# Patient Record
Sex: Female | Born: 1975
Health system: Southern US, Community
[De-identification: ages and names within clinical notes are randomized; demographics above are authoritative.]

## PROBLEM LIST (undated history)

## (undated) ENCOUNTER — Inpatient Hospital Stay (HOSPITAL_COMMUNITY): Payer: Self-pay

## (undated) DIAGNOSIS — Z87442 Personal history of urinary calculi: Secondary | ICD-10-CM

## (undated) DIAGNOSIS — D473 Essential (hemorrhagic) thrombocythemia: Secondary | ICD-10-CM

## (undated) DIAGNOSIS — Z8759 Personal history of other complications of pregnancy, childbirth and the puerperium: Secondary | ICD-10-CM

## (undated) DIAGNOSIS — R51 Headache: Secondary | ICD-10-CM

## (undated) DIAGNOSIS — D75839 Thrombocytosis, unspecified: Secondary | ICD-10-CM

## (undated) DIAGNOSIS — Z8619 Personal history of other infectious and parasitic diseases: Secondary | ICD-10-CM

## (undated) HISTORY — DX: Essential (hemorrhagic) thrombocythemia: D47.3

## (undated) HISTORY — DX: Personal history of other complications of pregnancy, childbirth and the puerperium: Z87.59

## (undated) HISTORY — DX: Personal history of other infectious and parasitic diseases: Z86.19

## (undated) HISTORY — PX: APPENDECTOMY: SHX54

## (undated) HISTORY — DX: Thrombocytosis, unspecified: D75.839

---

## 2002-09-19 ENCOUNTER — Encounter: Payer: Self-pay | Admitting: Obstetrics and Gynecology

## 2002-09-19 ENCOUNTER — Encounter (INDEPENDENT_AMBULATORY_CARE_PROVIDER_SITE_OTHER): Payer: Self-pay

## 2002-09-19 ENCOUNTER — Encounter: Payer: Self-pay | Admitting: Emergency Medicine

## 2002-09-19 ENCOUNTER — Ambulatory Visit (HOSPITAL_COMMUNITY): Admission: AD | Admit: 2002-09-19 | Discharge: 2002-09-19 | Payer: Self-pay | Admitting: Family Medicine

## 2002-09-19 HISTORY — PX: OTHER SURGICAL HISTORY: SHX169

## 2002-10-11 ENCOUNTER — Encounter: Admission: RE | Admit: 2002-10-11 | Discharge: 2002-10-11 | Payer: Self-pay | Admitting: Obstetrics & Gynecology

## 2009-06-25 ENCOUNTER — Inpatient Hospital Stay (HOSPITAL_COMMUNITY): Admission: AD | Admit: 2009-06-25 | Discharge: 2009-06-25 | Payer: Self-pay | Admitting: Obstetrics & Gynecology

## 2009-12-01 ENCOUNTER — Inpatient Hospital Stay (HOSPITAL_COMMUNITY): Admission: AD | Admit: 2009-12-01 | Discharge: 2009-12-01 | Payer: Self-pay | Admitting: Obstetrics and Gynecology

## 2009-12-01 ENCOUNTER — Ambulatory Visit: Payer: Self-pay | Admitting: Obstetrics and Gynecology

## 2009-12-01 ENCOUNTER — Inpatient Hospital Stay (HOSPITAL_COMMUNITY): Admission: AD | Admit: 2009-12-01 | Discharge: 2009-12-02 | Payer: Self-pay | Admitting: Family Medicine

## 2009-12-02 ENCOUNTER — Ambulatory Visit: Payer: Self-pay | Admitting: Family Medicine

## 2009-12-02 ENCOUNTER — Encounter: Payer: Self-pay | Admitting: Family Medicine

## 2009-12-02 ENCOUNTER — Inpatient Hospital Stay (HOSPITAL_COMMUNITY): Admission: AD | Admit: 2009-12-02 | Discharge: 2009-12-04 | Payer: Self-pay | Admitting: Family Medicine

## 2010-05-16 ENCOUNTER — Encounter: Payer: Self-pay | Admitting: Family Medicine

## 2010-07-09 LAB — CBC
HCT: 34.6 % — ABNORMAL LOW (ref 36.0–46.0)
Hemoglobin: 11.3 g/dL — ABNORMAL LOW (ref 12.0–15.0)
Hemoglobin: 9.6 g/dL — ABNORMAL LOW (ref 12.0–15.0)
MCH: 26.3 pg (ref 26.0–34.0)
MCHC: 32.8 g/dL (ref 30.0–36.0)
MCV: 81.2 fL (ref 78.0–100.0)
Platelets: 432 10*3/uL — ABNORMAL HIGH (ref 150–400)
RBC: 3.67 MIL/uL — ABNORMAL LOW (ref 3.87–5.11)
RDW: 18.4 % — ABNORMAL HIGH (ref 11.5–15.5)
WBC: 19.1 10*3/uL — ABNORMAL HIGH (ref 4.0–10.5)

## 2010-07-18 LAB — DIFFERENTIAL
Basophils Absolute: 0.1 10*3/uL (ref 0.0–0.1)
Basophils Relative: 1 % (ref 0–1)
Eosinophils Absolute: 0.6 10*3/uL (ref 0.0–0.7)
Eosinophils Relative: 6 % — ABNORMAL HIGH (ref 0–5)
Lymphocytes Relative: 23 % (ref 12–46)
Lymphs Abs: 2.1 10*3/uL (ref 0.7–4.0)
Monocytes Absolute: 0.6 10*3/uL (ref 0.1–1.0)
Monocytes Relative: 6 % (ref 3–12)
Neutro Abs: 6 10*3/uL (ref 1.7–7.7)
Neutrophils Relative %: 64 % (ref 43–77)

## 2010-07-18 LAB — COMPREHENSIVE METABOLIC PANEL
ALT: 16 U/L (ref 0–35)
AST: 17 U/L (ref 0–37)
Albumin: 2.8 g/dL — ABNORMAL LOW (ref 3.5–5.2)
Alkaline Phosphatase: 72 U/L (ref 39–117)
BUN: 6 mg/dL (ref 6–23)
CO2: 23 mEq/L (ref 19–32)
Calcium: 8.8 mg/dL (ref 8.4–10.5)
Chloride: 105 mEq/L (ref 96–112)
Creatinine, Ser: 0.5 mg/dL (ref 0.4–1.2)
GFR calc Af Amer: >60 mL/min (ref >60)
GFR calc non Af Amer: >60 mL/min (ref >60)
Glucose, Bld: 75 mg/dL (ref 70–99)
Potassium: 3.2 mEq/L — ABNORMAL LOW (ref 3.5–5.1)
Sodium: 136 mEq/L (ref 135–145)
Total Bilirubin: 0.2 mg/dL — ABNORMAL LOW (ref 0.3–1.2)
Total Protein: 6.4 g/dL (ref 6.0–8.3)

## 2010-07-18 LAB — WET PREP, GENITAL
Trich, Wet Prep: NONE SEEN
Yeast Wet Prep HPF POC: NONE SEEN

## 2010-07-18 LAB — URINALYSIS, ROUTINE W REFLEX MICROSCOPIC
Bilirubin Urine: NEGATIVE
Glucose, UA: NEGATIVE mg/dL
Ketones, ur: NEGATIVE mg/dL
Nitrite: NEGATIVE
Protein, ur: NEGATIVE mg/dL
Specific Gravity, Urine: 1.025 (ref 1.005–1.030)
Urobilinogen, UA: 0.2 mg/dL (ref 0.0–1.0)
pH: 6 (ref 5.0–8.0)

## 2010-07-18 LAB — URINE MICROSCOPIC-ADD ON

## 2010-07-18 LAB — CBC
MCHC: 32.7 g/dL (ref 30.0–36.0)
RDW: 16.2 % — ABNORMAL HIGH (ref 11.5–15.5)

## 2010-07-18 LAB — GC/CHLAMYDIA PROBE AMP, GENITAL
Chlamydia, DNA Probe: POSITIVE — AB
GC Probe Amp, Genital: NEGATIVE

## 2010-09-10 NOTE — Op Note (Signed)
NAMEMIMIE, [name]               ACCOUNT NO.:  [id number]   MEDICAL RECORD NO.:  [id number]                   PATIENT TYPE:  AMB   LOCATION:  SDC                                  FACILITY:  [facility]   PHYSICIAN:  Tanya S. Shawnie Pons, M.D.                DATE OF BIRTH:  Jan 27, 1976   DATE OF PROCEDURE:  09/19/2002  DATE OF DISCHARGE:                                 OPERATIVE REPORT   PREOPERATIVE DIAGNOSIS:  Left ectopic pregnancy.   POSTOPERATIVE DIAGNOSIS:  Left ectopic pregnancy.   PROCEDURE:  Diagnostic scope with a left salpingectomy.   SURGEON:  Shelbie Proctor. Shawnie Pons, M.D.   ASSISTANT:  Barney Drain, M.D.   FINDINGS:  Include a hemoperitoneum and a left ectopic pregnancy.   ESTIMATED BLOOD LOSS:  200 cc.   SPECIMENS:  Left tube.   COMPLICATIONS:  None.   INDICATIONS FOR PROCEDURE:  Reveals the patient is a 35 year old gravida 2,  para 1 who has an IUD in place for the past four years who presents with a  positive pregnancy test and abdominal pain to [facility].  She was  evaluated at that time and found to have a probable ectopic by ultrasound,  and was subsequently transferred to [facility].  Here the IUD was  removed and a repeat ultrasound showed increasing and free peritoneal fluid.  Her beta HCG was 1285.  There was nothing in the uterus and there was a  tubular or edematous tube in the left, as well as a 2.0 cm mass on the left.  The patient had significant pain and it was decided to take her to the  operating room for treatment.   The patient went to the operating room.  She was placed in the dorsal  lithotomy position and Allen stirrups.  She was prepped and draped in usual  sterile fashion.  A speculum was then placed over the vagina.  Cervix was  visualized, grasped with a single-tooth tenaculum, a Hulka tenaculum was  placed through the cervix and the single-tooth tenaculum removed.  The  speculum was also removed.  The Foley catheter was  placed.  Attention was  then turned to the abdomen where the umbilicus was lifted up using Allis  clamps after being injected with 0.25% Marcaine.  A knife was used to make  an incision in the umbilicus and a Veress needle placed through this  incision into the abdominal cavity without difficulty.  The CO2 was turned  on and the peritoneal cavity insufflated with three liters of air.  At that  time a disposable 10/12 trocar was then inserted through the incision with  towel clips being using to hold up the abdomen.  This was inserted easily  and without difficulty.   Once the camera was introduced immediately below the trocar site, the  abdominal contents were inspected and found to be normal.  The patient was  then placed in Trendelenburg.  The uterus lifted up  and a hemoperitoneum  noted.  At that time a large mass could be seen on the left tube.  Two 5 mm  ports were placed in the lower quadrant under direct visualization being  careful to avoid the inferior epigastrics.  After 0.25% Marcaine was  injected to the skin and a knife made to make the initial skin incision.  Two endoloop's were then passed over the tube and tied down.  Scissors used  to remove the ectopic pregnancy.  The ectopic was removed from our 5 mm port  in the right lower quadrant.  Attention was then turned to the site and the  __________ was used to irrigate as well as suction out the peritoneal  cavity.  There was noted to be a little bit of bleeding along the tube and  the bipolar cautery was used to ensure hemostasis.  The tube was then  inspected and found to be hemostatic and all the blood was suctioned out of  the posterior and anterior cul-de-sac.  The liver was inspected and found to  normal although there was a lot of blood up around the liver at this time.  Approximately 200 cc of saline were left inside the abdomen.   The right and left lower quadrant ports are removed under direct  visualization, as  was the umbilical port.  At the umbilicus, the fascial  defect was closed with a #1 Vicryl and UR6 needles in a figure-of-eight  fashion.  The skin was then closed with 4-0 Vicryl in a running subcuticular  fashion.  The right and left lower quadrant ports were closed and a simple  subcuticular stitch with Steri-Strips across.  The patient was awakened and  taken to the recovery room in stable condition.  All instrument, needle and  lap counts were correct x 2 at the end of the case.                                               Shelbie Proctor. Shawnie Pons, M.D.    TSP/MEDQ  D:  09/19/2002  T:  09/20/2002  Job:  [id number]

## 2011-04-26 NOTE — L&D Delivery Note (Signed)
Delivery Note At 11:12 AM a viable and healthy female was delivered via Vaginal, Spontaneous Delivery (Presentation: ; Occiput Anterior).  APGAR: 6, 9; weight 8 lb 12.6 oz (3986 g).   Placenta status: Intact, Spontaneous.  Cord: 3 vessels with the following complications: Shoulder dystocia x 1 minute.  McRoberts and suprapubic pressure were done without reduction of anterior shoulder.  Then, anterior shoulder was rotated with successful delivery of anterior followed by posterior shoulders and remainder of infant.  Cord pH: n/a  Anesthesia: Epidural  Episiotomy: None Lacerations: None Suture Repair: n/a Est. Blood Loss (mL): 250  Mom to postpartum.  Baby to nursery-stable.  -breast  Xoey Warmoth 01/21/2012, 11:24 AM

## 2011-04-26 NOTE — L&D Delivery Note (Signed)
Attended delivery and I agree with note Rosilyn Coachman

## 2011-08-08 ENCOUNTER — Encounter (HOSPITAL_COMMUNITY): Payer: Self-pay

## 2011-08-08 ENCOUNTER — Inpatient Hospital Stay (HOSPITAL_COMMUNITY)
Admission: AD | Admit: 2011-08-08 | Discharge: 2011-08-08 | Disposition: A | Discharge status: Home / Self Care | Payer: Self-pay | Source: Ambulatory Visit | Attending: Obstetrics and Gynecology | Admitting: Obstetrics and Gynecology

## 2011-08-08 DIAGNOSIS — L293 Anogenital pruritus, unspecified: Secondary | ICD-10-CM | POA: Insufficient documentation

## 2011-08-08 DIAGNOSIS — N949 Unspecified condition associated with female genital organs and menstrual cycle: Secondary | ICD-10-CM | POA: Insufficient documentation

## 2011-08-08 DIAGNOSIS — B3731 Acute candidiasis of vulva and vagina: Secondary | ICD-10-CM | POA: Insufficient documentation

## 2011-08-08 DIAGNOSIS — B373 Candidiasis of vulva and vagina: Secondary | ICD-10-CM

## 2011-08-08 LAB — WET PREP, GENITAL
Clue Cells Wet Prep HPF POC: NONE SEEN
Trich, Wet Prep: NONE SEEN

## 2011-08-08 MED ORDER — FLUCONAZOLE 150 MG PO TABS
150.0000 mg | ORAL_TABLET | Freq: Once | ORAL | Status: AC
  Administered 2011-08-08: 150 mg via ORAL
  Filled 2011-08-08: qty 1

## 2011-08-08 NOTE — Progress Notes (Signed)
[facility] Department of Clinical Social Work Documentation of Interpretation   I assisted __Judy RN_________________ with interpretation of __discharge instructions____________________ for this patient.

## 2011-08-08 NOTE — MAU Note (Signed)
Patient states she has not had her first appointment with [facility] yet. Has been having a yellow/green vaginal discharge, no bleeding and some mild lower abdominal pain off and on, none now.

## 2011-08-08 NOTE — Progress Notes (Signed)
[facility] Department of Clinical Social Work Documentation of Interpretation   I assisted _Brandy  RN__________________ with interpretation of ___questions___________________ for this patient. 

## 2011-08-08 NOTE — MAU Provider Note (Signed)
History     CSN: [id number]  Arrival date & time 08/08/11  4098   None     Chief Complaint  Patient presents with  . Vaginal Discharge     HPI Lauren [name] is a 36 y.o. female @ [redacted]w[redacted]d gestation who presents to MAU for vaginal discharge and itching. The symptoms started a few days ago. She denies vaginal bleeding or any other problems other than occasional nausea. She is a G4, with 2 full term deliveries and 1 ectopic that required surgery 6 years ago. Has started care at [facility]. The history was provided by the patient through [employer], Spanish translator.   No past medical history on file.  No past surgical history on file.  No family history on file.  History  Substance Use Topics  . Smoking status: Not on file  . Smokeless tobacco: Not on file  . Alcohol Use: Not on file      Review of Systems  Constitutional: Positive for fever and chills. Negative for appetite change.  HENT: Negative.   Eyes: Negative.   Respiratory: Negative.   Cardiovascular: Negative.   Gastrointestinal: Positive for nausea. Negative for vomiting, abdominal pain and diarrhea.  Genitourinary: Positive for vaginal discharge. Negative for dysuria, urgency, decreased urine volume, vaginal bleeding, vaginal pain and pelvic pain.  Neurological: Negative.   Psychiatric/Behavioral: Negative for behavioral problems and confusion. The patient is not nervous/anxious.     Allergies  Review of patient's allergies indicates no known allergies.  Home Medications  No current outpatient prescriptions on file.  BP 117/65  Pulse 86  Temp(Src) 99.2 F (37.3 C) (Oral)  Resp 16  Ht 5\' 3"  (1.6 m)  Wt 155 lb 9.6 oz (70.58 kg)  BMI 27.56 kg/m2  SpO2 99%  LMP 04/09/2011  Physical Exam  Nursing note and vitals reviewed. Constitutional: She is oriented to person, place, and time. She appears well-developed and well-nourished. No distress.  HENT:  Head: Normocephalic.  Eyes: EOM are  normal.  Neck: Neck supple.  Pulmonary/Chest: Effort normal.  Abdominal: Soft. There is no tenderness.       Positive FHT's 150  Genitourinary:       External genitalia without lesions. Thick white discharge vaginal vault. Cervix inflamed, closed, no CMT, no adnexal tenderness. Uterus approximately 20 week size.  Musculoskeletal: Normal range of motion.  Neurological: She is alert and oriented to person, place, and time. No cranial nerve deficit.  Skin: Skin is warm and dry.  Psychiatric: She has a normal mood and affect. Her behavior is normal. Judgment and thought content normal.   Results for orders placed during the hospital encounter of 08/08/11 (from the past 24 hour(s))  WET PREP, GENITAL     Status: Abnormal   Collection Time   08/08/11 10:41 AM      Component Value Range   Yeast Wet Prep HPF POC MODERATE (*) NONE SEEN    Trich, Wet Prep NONE SEEN  NONE SEEN    Clue Cells Wet Prep HPF POC NONE SEEN  NONE SEEN    WBC, Wet Prep HPF POC MANY (*) NONE SEEN    Assessment: Vaginal discharge in second trimester pregnancy   Monilia   Plan:  Diflucan 150 mg. PO now   Continue [facility] with [facility]   Return here as needed.  ED Course: Discussed with Dr. Jolayne Panther, will not do ultrasound today since patient has no complaint of abdominal pain or bleeding and has established care with the  Health Department.  Procedures   MDM

## 2011-08-10 LAB — GC/CHLAMYDIA PROBE AMP, GENITAL: GC Probe Amp, Genital: NEGATIVE

## 2011-08-11 ENCOUNTER — Telehealth (HOSPITAL_COMMUNITY): Payer: Self-pay | Admitting: Nurse Practitioner

## 2011-08-11 NOTE — Telephone Encounter (Signed)
Telephone call to patient with [facility] Interpreters regarding positive chlamydia culture, patient notified.  Instructed patient schedule treatment with [facility].  Instructed patient to notify her partner for treatment.  Connie Lithicum with [facility] notified of result.  Report faxed to health department.

## 2011-08-15 NOTE — MAU Provider Note (Signed)
Agree with above note.  Lauren [name] 08/15/2011 9:37 AM

## 2011-09-05 ENCOUNTER — Other Ambulatory Visit (HOSPITAL_COMMUNITY): Payer: Self-pay | Admitting: Physician Assistant

## 2011-09-05 DIAGNOSIS — Z3689 Encounter for other specified antenatal screening: Secondary | ICD-10-CM

## 2011-09-05 LAB — OB RESULTS CONSOLE ANTIBODY SCREEN: Antibody Screen: NEGATIVE

## 2011-09-05 LAB — OB RESULTS CONSOLE ABO/RH: RH Type: POSITIVE

## 2011-09-05 LAB — OB RESULTS CONSOLE RUBELLA ANTIBODY, IGM: Rubella: IMMUNE

## 2011-09-05 LAB — OB RESULTS CONSOLE GC/CHLAMYDIA: Gonorrhea: NEGATIVE

## 2011-09-07 ENCOUNTER — Ambulatory Visit (HOSPITAL_COMMUNITY)
Admission: RE | Admit: 2011-09-07 | Discharge: 2011-09-07 | Disposition: A | Discharge status: Home / Self Care | Payer: Self-pay | Source: Ambulatory Visit | Attending: Physician Assistant | Admitting: Physician Assistant

## 2011-09-07 DIAGNOSIS — O09299 Supervision of pregnancy with other poor reproductive or obstetric history, unspecified trimester: Secondary | ICD-10-CM | POA: Insufficient documentation

## 2011-09-07 DIAGNOSIS — Z363 Encounter for antenatal screening for malformations: Secondary | ICD-10-CM | POA: Insufficient documentation

## 2011-09-07 DIAGNOSIS — Z3689 Encounter for other specified antenatal screening: Secondary | ICD-10-CM

## 2011-09-07 DIAGNOSIS — O358XX0 Maternal care for other (suspected) fetal abnormality and damage, not applicable or unspecified: Secondary | ICD-10-CM | POA: Insufficient documentation

## 2011-09-07 DIAGNOSIS — Z1389 Encounter for screening for other disorder: Secondary | ICD-10-CM | POA: Insufficient documentation

## 2012-01-16 ENCOUNTER — Other Ambulatory Visit (HOSPITAL_COMMUNITY): Payer: Self-pay | Admitting: Nurse Practitioner

## 2012-01-16 ENCOUNTER — Encounter (HOSPITAL_COMMUNITY): Payer: Self-pay | Admitting: *Deleted

## 2012-01-16 ENCOUNTER — Telehealth (HOSPITAL_COMMUNITY): Payer: Self-pay | Admitting: *Deleted

## 2012-01-16 DIAGNOSIS — O480 Post-term pregnancy: Secondary | ICD-10-CM

## 2012-01-16 NOTE — Telephone Encounter (Signed)
Preadmission screen Interpreter number [phone]

## 2012-01-16 NOTE — Telephone Encounter (Signed)
Preadmission screen Interpreter number [phone]

## 2012-01-17 ENCOUNTER — Ambulatory Visit (HOSPITAL_COMMUNITY)
Admission: RE | Admit: 2012-01-17 | Discharge: 2012-01-17 | Disposition: A | Discharge status: Home / Self Care | Payer: Self-pay | Source: Ambulatory Visit | Attending: Nurse Practitioner | Admitting: Nurse Practitioner

## 2012-01-17 DIAGNOSIS — Z3689 Encounter for other specified antenatal screening: Secondary | ICD-10-CM | POA: Insufficient documentation

## 2012-01-17 DIAGNOSIS — O480 Post-term pregnancy: Secondary | ICD-10-CM | POA: Insufficient documentation

## 2012-01-20 ENCOUNTER — Inpatient Hospital Stay (HOSPITAL_COMMUNITY)
Admission: AD | Admit: 2012-01-20 | Discharge: 2012-01-23 | DRG: 775 | Disposition: A | Discharge status: Home / Self Care | Payer: Medicaid Other | Source: Ambulatory Visit | Attending: Obstetrics & Gynecology | Admitting: Obstetrics & Gynecology

## 2012-01-20 ENCOUNTER — Inpatient Hospital Stay (HOSPITAL_COMMUNITY): Payer: Medicaid Other | Admitting: Anesthesiology

## 2012-01-20 ENCOUNTER — Encounter (HOSPITAL_COMMUNITY): Payer: Self-pay | Admitting: Anesthesiology

## 2012-01-20 ENCOUNTER — Encounter (HOSPITAL_COMMUNITY): Payer: Self-pay | Admitting: *Deleted

## 2012-01-20 DIAGNOSIS — O480 Post-term pregnancy: Principal | ICD-10-CM | POA: Diagnosis present

## 2012-01-20 DIAGNOSIS — O99892 Other specified diseases and conditions complicating childbirth: Secondary | ICD-10-CM | POA: Diagnosis present

## 2012-01-20 DIAGNOSIS — IMO0001 Reserved for inherently not codable concepts without codable children: Secondary | ICD-10-CM

## 2012-01-20 DIAGNOSIS — Z22330 Carrier of Group B streptococcus: Secondary | ICD-10-CM

## 2012-01-20 LAB — CBC
Hemoglobin: 11.6 g/dL — ABNORMAL LOW (ref 12.0–15.0)
RBC: 4.06 MIL/uL (ref 3.87–5.11)

## 2012-01-20 LAB — PREPARE RBC (CROSSMATCH)

## 2012-01-20 MED ORDER — CITRIC ACID-SODIUM CITRATE 334-500 MG/5ML PO SOLN: 30.0000 mL | ORAL | Status: DC | PRN

## 2012-01-20 MED ORDER — OXYCODONE-ACETAMINOPHEN 5-325 MG PO TABS
1.0000 | ORAL_TABLET | ORAL | Status: DC | PRN
  Administered 2012-01-21: 1 via ORAL
  Filled 2012-01-20: qty 1

## 2012-01-20 MED ORDER — ONDANSETRON HCL 4 MG/2ML IJ SOLN: 4.0000 mg | Freq: Four times a day (QID) | INTRAMUSCULAR | Status: DC | PRN

## 2012-01-20 MED ORDER — TERBUTALINE SULFATE 1 MG/ML IJ SOLN: 0.2500 mg | Freq: Once | INTRAMUSCULAR | Status: AC | PRN

## 2012-01-20 MED ORDER — DIPHENHYDRAMINE HCL 50 MG/ML IJ SOLN: 12.5000 mg | INTRAMUSCULAR | Status: DC | PRN

## 2012-01-20 MED ORDER — EPHEDRINE 5 MG/ML INJ: 10.0000 mg | INTRAVENOUS | Status: DC | PRN

## 2012-01-20 MED ORDER — PHENYLEPHRINE 40 MCG/ML (10ML) SYRINGE FOR IV PUSH (FOR BLOOD PRESSURE SUPPORT): 80.0000 ug | PREFILLED_SYRINGE | INTRAVENOUS | Status: DC | PRN

## 2012-01-20 MED ORDER — LIDOCAINE HCL (PF) 1 % IJ SOLN
30.0000 mL | INTRAMUSCULAR | Status: DC | PRN
  Filled 2012-01-20: qty 30

## 2012-01-20 MED ORDER — LACTATED RINGERS IV SOLN
INTRAVENOUS | Status: DC
  Administered 2012-01-20: 19:00:00 via INTRAVENOUS

## 2012-01-20 MED ORDER — EPHEDRINE 5 MG/ML INJ
10.0000 mg | INTRAVENOUS | Status: DC | PRN
  Filled 2012-01-20: qty 4

## 2012-01-20 MED ORDER — LIDOCAINE HCL (PF) 1 % IJ SOLN
INTRAMUSCULAR | Status: DC | PRN
  Administered 2012-01-20 (×2): 9 mL

## 2012-01-20 MED ORDER — FENTANYL 2.5 MCG/ML BUPIVACAINE 1/10 % EPIDURAL INFUSION (WH - ANES)
14.0000 mL/h | INTRAMUSCULAR | Status: DC
  Administered 2012-01-20 – 2012-01-21 (×4): 14 mL/h via EPIDURAL
  Filled 2012-01-20 (×5): qty 60

## 2012-01-20 MED ORDER — NALBUPHINE SYRINGE 5 MG/0.5 ML: 5.0000 mg | INJECTION | INTRAMUSCULAR | Status: DC | PRN

## 2012-01-20 MED ORDER — ACETAMINOPHEN 325 MG PO TABS
650.0000 mg | ORAL_TABLET | ORAL | Status: DC | PRN
  Administered 2012-01-21: 650 mg via ORAL
  Filled 2012-01-20: qty 2

## 2012-01-20 MED ORDER — OXYTOCIN 40 UNITS IN LACTATED RINGERS INFUSION - SIMPLE MED: 1.0000 m[IU]/min | INTRAVENOUS | Status: DC

## 2012-01-20 MED ORDER — OXYTOCIN BOLUS FROM INFUSION
500.0000 mL | Freq: Once | INTRAVENOUS | Status: DC
  Filled 2012-01-20: qty 500

## 2012-01-20 MED ORDER — LACTATED RINGERS IV SOLN: 500.0000 mL | INTRAVENOUS | Status: DC | PRN

## 2012-01-20 MED ORDER — FENTANYL 2.5 MCG/ML BUPIVACAINE 1/10 % EPIDURAL INFUSION (WH - ANES)
INTRAMUSCULAR | Status: DC | PRN
  Administered 2012-01-20: 14 mL/h via EPIDURAL

## 2012-01-20 MED ORDER — OXYTOCIN 40 UNITS IN LACTATED RINGERS INFUSION - SIMPLE MED
62.5000 mL/h | Freq: Once | INTRAVENOUS | Status: DC
  Filled 2012-01-20: qty 1000

## 2012-01-20 MED ORDER — IBUPROFEN 600 MG PO TABS
600.0000 mg | ORAL_TABLET | Freq: Four times a day (QID) | ORAL | Status: DC | PRN
  Administered 2012-01-21: 600 mg via ORAL
  Filled 2012-01-20: qty 1

## 2012-01-20 MED ORDER — PENICILLIN G POTASSIUM 5000000 UNITS IJ SOLR
2.5000 10*6.[IU] | INTRAVENOUS | Status: DC
  Administered 2012-01-20 – 2012-01-21 (×5): 2.5 10*6.[IU] via INTRAVENOUS
  Filled 2012-01-20 (×9): qty 2.5

## 2012-01-20 MED ORDER — LACTATED RINGERS IV SOLN
500.0000 mL | Freq: Once | INTRAVENOUS | Status: AC
  Administered 2012-01-20: 500 mL via INTRAVENOUS

## 2012-01-20 MED ORDER — PHENYLEPHRINE 40 MCG/ML (10ML) SYRINGE FOR IV PUSH (FOR BLOOD PRESSURE SUPPORT)
80.0000 ug | PREFILLED_SYRINGE | INTRAVENOUS | Status: DC | PRN
  Filled 2012-01-20: qty 5

## 2012-01-20 MED ORDER — PENICILLIN G POTASSIUM 5000000 UNITS IJ SOLR
5.0000 10*6.[IU] | Freq: Once | INTRAVENOUS | Status: AC
  Administered 2012-01-20: 5 10*6.[IU] via INTRAVENOUS
  Filled 2012-01-20: qty 5

## 2012-01-20 NOTE — Anesthesia Preprocedure Evaluation (Signed)

## 2012-01-20 NOTE — Progress Notes (Signed)
Patient ID: Bonnee Quin, female   DOB: January 12, 1976, 36 y.o.   MRN: [id number]   S:  Pt feeling more pain with contractions and more frequent.  O:   Filed Vitals:   01/20/12 1432  BP: 124/80  Pulse: 72  Temp: 97.9 F (36.6 C)  Resp: 18   SVE:  4-5/50/-3, very posterior, very narrow introitus FHTs:  130-140, moderate variability, + accels, no decels CTX:  q 1-2 minutes  A/P  Epidural.  1st dose PCN going in. AROM when head better applied.  Napoleon Form, MD

## 2012-01-20 NOTE — Anesthesia Procedure Notes (Signed)
Epidural Patient location during procedure: OB Start time: 01/20/2012 4:32 PM End time: 01/20/2012 4:36 PM  Staffing Anesthesiologist: Sandrea Hughs Performed by: anesthesiologist   Preanesthetic Checklist Completed: patient identified, site marked, surgical consent, pre-op evaluation, timeout performed, IV checked and monitors and equipment checked  Epidural Patient position: sitting Prep: site prepped and draped and DuraPrep Patient monitoring: continuous pulse ox and blood pressure Approach: midline Injection technique: LOR air  Needle:  Needle type: Tuohy  Needle gauge: 17 G Needle length: 9 cm and 9 Needle insertion depth: 5 cm cm Catheter type: closed end flexible Catheter size: 19 Gauge Catheter at skin depth: 10 cm Test dose: negative and Other  Assessment Sensory level: T8 Events: blood not aspirated, injection not painful, no injection resistance, negative IV test and no paresthesia  Additional Notes Reason for block:procedure for pain

## 2012-01-20 NOTE — Progress Notes (Signed)
Dr Thad Ranger notified of pt's arrival and status, FHR, VE, and contraction pattern. Orders received

## 2012-01-20 NOTE — Progress Notes (Signed)
[facility] Department of Clinical Social Work Documentation of Interpretation   I assisted __Donna RN_________________ with interpretation of ____questions__________________ for this patient. 

## 2012-01-20 NOTE — Progress Notes (Signed)
OB Labor progress note  S: Pt feeling comfortable with epidural O: BP 129/76  Pulse 73  Temp 97.9 F (36.6 C) (Oral)  Resp 20  Ht 5\' 2"  (1.575 m)  Wt 85.004 kg (187 lb 6.4 oz)  BMI 34.28 kg/m2  SpO2 98%  LMP 04/09/2011 SVE: 5/50/-3, AROM'ed with light meconium-stained fluid FHTs: 140s bpm, moderate variability, accelerations present, no decelerations, category I tracing Contractions every 3-4 minutes, regular  A/P:  - Received epidural, AROM'ed at 5:10pm w/light mec.  Rcd 1st dose PCN.  Simone Curia 01/20/2012 5:40 PM

## 2012-01-20 NOTE — Progress Notes (Signed)
Dr Jean Rosenthal going off call, Dr Arby Barrette coming on. Dr Arby Barrette to call when available.

## 2012-01-20 NOTE — H&P (Signed)
Lauren [name] is a 36 y.o. female presenting for active labor. Maternal Medical History:  Reason for admission: Reason for admission: contractions.  Reason for Admission:   nauseaContractions: Onset was 6-12 hours ago.    Fetal activity: Perceived fetal activity is normal.   Last perceived fetal movement was within the past hour.    Prenatal complications: no prenatal complications Prenatal Complications - Diabetes: none.    OB History    Grav Para Term Preterm Abortions TAB SAB Ect Mult Living   4 2 2  0 1 0 0 1 0 2     Past Medical History  Diagnosis Date  . Abnormal Pap smear   . Hx of chlamydia infection   . Fibroid   . Hx: UTI (urinary tract infection)   . Heart murmur     asymptomatic systolic murmur  . Hx of ectopic pregnancy   . Late prenatal care   . Language Barrier   . Thrombocytosis    Past Surgical History  Procedure Date  . Ectopic pregnancy surgery 6years ago  . Salpingectomy    Family History: family history includes Hypertension in her son and Seizures in her son. Social History:  reports that she has never smoked. She does not have any smokeless tobacco history on file. She reports that she does not drink alcohol or use illicit drugs.   Prenatal Transfer Tool  Maternal Diabetes: No Genetic Screening: Declined Maternal Ultrasounds/Referrals: Normal Fetal Ultrasounds or other Referrals:  None Maternal Substance Abuse:  No Significant Maternal Medications:  None Significant Maternal Lab Results:  Lab values include: Group B Strep positive Other Comments:  None  Review of Systems  Constitutional: Negative for fever and chills.  Eyes: Negative for blurred vision and double vision.  Gastrointestinal: Negative for nausea, vomiting, diarrhea and constipation.  Genitourinary: Negative for dysuria.  Neurological: Negative for headaches.    Dilation: 4.5 Effacement (%): 80 Station: -2 Exam by:: ginger Morris RN Blood pressure 127/83,  pulse 92, temperature 98.3 F (36.8 C), temperature source Oral, resp. rate 18, height 5\' 2"  (1.575 m), weight 85.004 kg (187 lb 6.4 oz), last menstrual period 04/09/2011, SpO2 99.00%. Maternal Exam:  Uterine Assessment: Contraction strength is moderate.  Contraction frequency is regular.   Abdomen: Fundal height is appropriate for gestational age.       Fetal Exam Fetal Monitor Review: Mode: ultrasound.   Baseline rate: 140.  Variability: moderate (6-25 bpm).   Pattern: accelerations present and no decelerations.    Fetal State Assessment: Category I - tracings are normal.     Physical Exam  Constitutional: She is oriented to person, place, and time. She appears well-developed and well-nourished. No distress.  HENT:  Head: Normocephalic and atraumatic.  Eyes: Conjunctivae normal and EOM are normal.  Neck: Normal range of motion. Neck supple.  Cardiovascular: Normal rate and regular rhythm.   Respiratory: Effort normal. No respiratory distress.  GI: Soft. There is no tenderness. There is no rebound and no guarding.  Musculoskeletal: She exhibits no edema and no tenderness.  Neurological: She is alert and oriented to person, place, and time.  Skin: Skin is warm and dry.  Psychiatric: She has a normal mood and affect.    Prenatal labs: ABO, Rh: B/Positive/-- (05/13 0000) Antibody: Negative (05/13 0000) Rubella: Immune (05/13 0000) RPR: Nonreactive (05/13 0000)  HBsAg: Negative (05/13 0000)  HIV: Non-reactive (05/13 0000)  GBS: Positive (08/30 0000)   Assessment/Plan: 36 y.o. [id number] at [redacted]w[redacted]d with painful contractions and cervical change,  post-dates Admit for active labor Epidural when desires GBS positive - penicillin AROM when blocked and has first dose of penicillin.  Napoleon Form 01/20/2012, 1:56 PM

## 2012-01-20 NOTE — Progress Notes (Signed)
Patient ID: Lauren [name], female   DOB: 02/09/1976, 36 y.o.   MRN: [id number]  Pt feeling pain on right side despite epidural - breathing through it.    O:   Filed Vitals:   01/20/12 2225 01/20/12 2259 01/20/12 2301 01/20/12 2331  BP: 129/65  116/64 116/58  Pulse: 72  73 67  Temp:      TempSrc:      Resp: 18 18  18   Height:      Weight:      SpO2:       SVE:  8/90/-2 CTX:  q 2-3 minutes Pitocin at 2  FHTs:  135-140, mod var, accels present, occasional earlies and variables.  A/P Foley placed. Turned patient to right side to attempt to turn baby and distribute epidural medication better. Continue pitocin. Anticipate SVD.  Napoleon Form, MD

## 2012-01-21 ENCOUNTER — Encounter (HOSPITAL_COMMUNITY): Payer: Self-pay | Admitting: *Deleted

## 2012-01-21 DIAGNOSIS — O480 Post-term pregnancy: Secondary | ICD-10-CM

## 2012-01-21 DIAGNOSIS — O9989 Other specified diseases and conditions complicating pregnancy, childbirth and the puerperium: Secondary | ICD-10-CM

## 2012-01-21 LAB — PREPARE RBC (CROSSMATCH)

## 2012-01-21 LAB — RPR: RPR Ser Ql: NONREACTIVE

## 2012-01-21 MED ORDER — ONDANSETRON HCL 4 MG PO TABS: 4.0000 mg | ORAL_TABLET | ORAL | Status: DC | PRN

## 2012-01-21 MED ORDER — IBUPROFEN 600 MG PO TABS
600.0000 mg | ORAL_TABLET | Freq: Four times a day (QID) | ORAL | Status: DC
  Administered 2012-01-21 – 2012-01-23 (×7): 600 mg via ORAL
  Filled 2012-01-21 (×7): qty 1

## 2012-01-21 MED ORDER — PRENATAL MULTIVITAMIN CH
1.0000 | ORAL_TABLET | Freq: Every day | ORAL | Status: DC
  Administered 2012-01-22 – 2012-01-23 (×2): 1 via ORAL
  Filled 2012-01-21 (×2): qty 1

## 2012-01-21 MED ORDER — OXYCODONE-ACETAMINOPHEN 5-325 MG PO TABS
1.0000 | ORAL_TABLET | ORAL | Status: DC | PRN
  Administered 2012-01-21 – 2012-01-22 (×2): 1 via ORAL
  Filled 2012-01-21 (×3): qty 1

## 2012-01-21 MED ORDER — DIBUCAINE 1 % RE OINT: 1.0000 "application " | TOPICAL_OINTMENT | RECTAL | Status: DC | PRN

## 2012-01-21 MED ORDER — BENZOCAINE-MENTHOL 20-0.5 % EX AERO
1.0000 "application " | INHALATION_SPRAY | CUTANEOUS | Status: DC | PRN
  Administered 2012-01-21: 1 via TOPICAL
  Filled 2012-01-21: qty 56

## 2012-01-21 MED ORDER — SENNOSIDES-DOCUSATE SODIUM 8.6-50 MG PO TABS
2.0000 | ORAL_TABLET | Freq: Every day | ORAL | Status: DC
  Administered 2012-01-21 – 2012-01-22 (×2): 2 via ORAL

## 2012-01-21 MED ORDER — TETANUS-DIPHTH-ACELL PERTUSSIS 5-2.5-18.5 LF-MCG/0.5 IM SUSP: 0.5000 mL | Freq: Once | INTRAMUSCULAR | Status: DC

## 2012-01-21 MED ORDER — LANOLIN HYDROUS EX OINT: TOPICAL_OINTMENT | CUTANEOUS | Status: DC | PRN

## 2012-01-21 MED ORDER — ZOLPIDEM TARTRATE 5 MG PO TABS: 5.0000 mg | ORAL_TABLET | Freq: Every evening | ORAL | Status: DC | PRN

## 2012-01-21 MED ORDER — SIMETHICONE 80 MG PO CHEW
80.0000 mg | CHEWABLE_TABLET | ORAL | Status: DC | PRN
Start: 2012-01-21 — End: 2012-01-23

## 2012-01-21 MED ORDER — DIPHENHYDRAMINE HCL 25 MG PO CAPS: 25.0000 mg | ORAL_CAPSULE | Freq: Four times a day (QID) | ORAL | Status: DC | PRN

## 2012-01-21 MED ORDER — METHYLERGONOVINE MALEATE 0.2 MG PO TABS
0.2000 mg | ORAL_TABLET | ORAL | Status: DC | PRN
  Administered 2012-01-22 (×5): 0.2 mg via ORAL
  Filled 2012-01-21 (×6): qty 1

## 2012-01-21 MED ORDER — ONDANSETRON HCL 4 MG/2ML IJ SOLN: 4.0000 mg | INTRAMUSCULAR | Status: DC | PRN

## 2012-01-21 MED ORDER — WITCH HAZEL-GLYCERIN EX PADS: 1.0000 "application " | MEDICATED_PAD | CUTANEOUS | Status: DC | PRN

## 2012-01-21 MED ORDER — METHYLERGONOVINE MALEATE 0.2 MG/ML IJ SOLN: 0.2000 mg | INTRAMUSCULAR | Status: DC | PRN

## 2012-01-21 NOTE — Progress Notes (Signed)
Called to discuss continued "trickling" of vaginal bleeding with Dr. Debroah Loop and came to evaluate pt.  VSS, pt in no distress, uterus firm. Will order methergine to help stop light post-partum bleeding as nursing has requested.  Will continue to monitor patient for instability but suspect that pt will continue to do well overnight.  Lauren [name] 01/21/2012, 8:13 PM

## 2012-01-21 NOTE — Progress Notes (Signed)
Patient continues to have moderate lochia without clots. Fundus 1 above and firm. Dr Fara Boros called and Methergine requested. Dr Fara Boros stated " I talked with Dr Debroah Loop about the situation when the previous nurse called and He did not want medication ordered at this time".  House Coverage notified about patient status. Will continue to monitor patient for changes.

## 2012-01-21 NOTE — Progress Notes (Signed)
Patient ID: Lauren [name], female   DOB: 1976/02/02, 36 y.o.   MRN: [id number]  S: Blocked and comfortable  O:   Filed Vitals:   01/21/12 0401 01/21/12 0411 01/21/12 0431 01/21/12 0501  BP: 121/71  113/62 102/61  Pulse: 91  78 84  Temp:    98.3 F (36.8 C)  TempSrc:    Oral  Resp:  18  18  Height:      Weight:      SpO2:        SVE: complete/+1 FHTs: 140s, mod var, accels present,  No decels CTX q1-3 min  A/P  Will start pushing. Napoleon Form, MD

## 2012-01-21 NOTE — Progress Notes (Signed)
Patient ID: Lauren [name], female   DOB: 1975/08/04, 36 y.o.   MRN: [id number]  Pt comfortable. Feels pressure with contractions.  SVE:  9.5/100/0 to -1  FHTs:  150, mod var, accels present, no decels.  CTX:  q1-2.  Pitocin at 4  A/P:  Slow progression but some descent. Will reposition again and continue pitocin.  Napoleon Form, MD

## 2012-01-21 NOTE — Progress Notes (Signed)
Patient ID: Lauren [name], female   DOB: May 18, 1975, 36 y.o.   MRN: [id number]  Pt vomiting. Feeling pressure but no pain.  O: Filed Vitals:   01/21/12 0157 01/21/12 0200 01/21/12 0231 01/21/12 0301  BP:  121/58 129/65 133/80  Pulse:  77 65 74  Temp: 98.6 F (37 C)     TempSrc: Oral     Resp: 18  18 18   Height:      Weight:      SpO2:        SVE:  9.5/100/-1 CTX:  q 2 minutes, pitocin at 4 FHTs:  135, mod var, accels present, decel present during prolonged vomiting but otherwise occasional early.  A/P:  Continue on pitocin. Baby still high but cervix progressively dilating (slow). Expect SVD  Napoleon Form, MD

## 2012-01-21 NOTE — Anesthesia Postprocedure Evaluation (Signed)
  Anesthesia Post-op Note  Patient: Lauren [name]  Procedure(s) Performed: * No procedures listed *  Patient Location: PACU and Mother/Baby  Anesthesia Type: Epidural  Level of Consciousness: awake, alert  and oriented  Airway and Oxygen Therapy: Patient Spontanous Breathing    Post-op Assessment: Patient's Cardiovascular Status Stable and Respiratory Function Stable  Post-op Vital Signs: stable  Complications: No apparent anesthesia complications

## 2012-01-21 NOTE — Progress Notes (Signed)
1191 Dr Fara Boros called and notified pt pushing and fhr having vbls and lates and therefore I have started oxygen and a fluid bolus and I am pushing with only every other UC. At this moment Dr Fara Boros walked in to the room and viewed the tracing and pt pushing.

## 2012-01-22 LAB — CBC
MCH: 29 pg (ref 26.0–34.0)
MCHC: 33.1 g/dL (ref 30.0–36.0)
RDW: 16.4 % — ABNORMAL HIGH (ref 11.5–15.5)

## 2012-01-22 NOTE — Progress Notes (Signed)
Post Partum Day 1 Subjective: tolerating PO, not out of bed yet, feels some dizziness, pain managed well with meds  Objective: Blood pressure 105/69, pulse 71, temperature 97.7 F (36.5 C), temperature source Oral, resp. rate 20, height 5\' 2"  (1.575 m), weight 85.004 kg (187 lb 6.4 oz), last menstrual period 04/09/2011, SpO2 98.00%, unknown if currently breastfeeding.  Physical Exam:  General: alert and cooperative Lochia: appropriate Uterine Fundus: firm Incision: healing well DVT Evaluation: No evidence of DVT seen on physical exam. Foley patent, draining clear yellow urine   Basename 01/20/12 1440  HGB 11.6*  HCT 35.3*    Assessment/Plan: PPD#1 Breastfeeding  Check orthostatics and CBC this am, plan for d/c tomorrow   LOS: 2 days   Bhambri, Melanie N 01/22/2012, 8:58 AM    I have seen and examined this patient and agree the above assessment. CRESENZO-DISHMAN,Rayanna Matusik 01/22/2012 11:50 AM

## 2012-01-23 LAB — TYPE AND SCREEN
Unit division: 0
Unit division: 0

## 2012-01-23 MED ORDER — IBUPROFEN 600 MG PO TABS: 600.0000 mg | ORAL_TABLET | Freq: Four times a day (QID) | ORAL | Status: DC

## 2012-01-23 MED ORDER — INFLUENZA VIRUS VACC SPLIT PF IM SUSP
0.5000 mL | Freq: Once | INTRAMUSCULAR | Status: AC
  Administered 2012-01-23: 0.5 mL via INTRAMUSCULAR

## 2012-01-23 MED ORDER — SENNOSIDES-DOCUSATE SODIUM 8.6-50 MG PO TABS: 2.0000 | ORAL_TABLET | Freq: Every day | ORAL | Status: DC

## 2012-01-23 NOTE — Discharge Summary (Signed)
Obstetric Discharge Summary Reason for Admission: onset of labor Prenatal Procedures: ultrasound Intrapartum Procedures: spontaneous vaginal delivery and GBS prophylaxis Postpartum Procedures: none Complications-Operative and Postpartum: methergibe x1 for postpartum "trickling"  Hemoglobin  Date Value Range Status  01/22/2012 10.6* 12.0 - 15.0 g/dL Final     HCT  Date Value Range Status  01/22/2012 32.0* 36.0 - 46.0 % Final    Physical Exam:  General: alert, cooperative and no distress Lochia: appropriate Uterine Fundus: firm Incision:n/a DVT Evaluation: No evidence of DVT seen on physical exam.  Discharge Diagnoses: Term Pregnancy-delivered  Discharge Information: Date: 01/23/2012 Activity: pelvic rest Diet: routine Medications: PNV, Ibuprofen and Colace Condition: stable Instructions: refer to practice specific booklet Discharge to: home Follow-up Information    Follow up with [facility]-. In 6 weeks.   Contact information:   [address] [place] [address] [phone]         Newborn Data: Live born female  Birth Weight: 8 lb 12.6 oz (3986 g) APGAR: 6, 9  Home with mother.  OCPs for birth control Breast feeding F/u HD in 6 weeks  Ralph Benavidez 01/23/2012, 8:11 AM

## 2012-01-23 NOTE — Progress Notes (Signed)
Post discharge review completed. 

## 2012-01-24 ENCOUNTER — Inpatient Hospital Stay (HOSPITAL_COMMUNITY): Admission: RE | Admit: 2012-01-24 | Payer: Self-pay | Source: Ambulatory Visit

## 2012-01-26 NOTE — Discharge Summary (Signed)
Pt seen and examined with resident.  LEGGETT,KELLY H.

## 2012-08-01 ENCOUNTER — Encounter (HOSPITAL_COMMUNITY): Payer: Self-pay | Admitting: Emergency Medicine

## 2012-08-01 ENCOUNTER — Emergency Department (HOSPITAL_COMMUNITY)
Admission: EM | Admit: 2012-08-01 | Discharge: 2012-08-02 | Disposition: A | Discharge status: Home / Self Care | Payer: Self-pay | Attending: Emergency Medicine | Admitting: Emergency Medicine

## 2012-08-01 DIAGNOSIS — J020 Streptococcal pharyngitis: Secondary | ICD-10-CM | POA: Insufficient documentation

## 2012-08-01 DIAGNOSIS — IMO0001 Reserved for inherently not codable concepts without codable children: Secondary | ICD-10-CM | POA: Insufficient documentation

## 2012-08-01 DIAGNOSIS — Z8742 Personal history of other diseases of the female genital tract: Secondary | ICD-10-CM | POA: Insufficient documentation

## 2012-08-01 DIAGNOSIS — Z87440 Personal history of urinary (tract) infections: Secondary | ICD-10-CM | POA: Insufficient documentation

## 2012-08-01 DIAGNOSIS — R011 Cardiac murmur, unspecified: Secondary | ICD-10-CM | POA: Insufficient documentation

## 2012-08-01 DIAGNOSIS — Z3202 Encounter for pregnancy test, result negative: Secondary | ICD-10-CM | POA: Insufficient documentation

## 2012-08-01 DIAGNOSIS — J3489 Other specified disorders of nose and nasal sinuses: Secondary | ICD-10-CM | POA: Insufficient documentation

## 2012-08-01 DIAGNOSIS — J029 Acute pharyngitis, unspecified: Secondary | ICD-10-CM | POA: Insufficient documentation

## 2012-08-01 DIAGNOSIS — Z8619 Personal history of other infectious and parasitic diseases: Secondary | ICD-10-CM | POA: Insufficient documentation

## 2012-08-01 DIAGNOSIS — Z862 Personal history of diseases of the blood and blood-forming organs and certain disorders involving the immune mechanism: Secondary | ICD-10-CM | POA: Insufficient documentation

## 2012-08-01 NOTE — ED Notes (Signed)
Pt hyperventilating on arrival to ED. C/o bilateral hands contracting and mouth "drawing to the side" since 4pm today. Symptoms started after argument with her son.

## 2012-08-02 LAB — POCT I-STAT, CHEM 8
BUN: 14 mg/dL (ref 6–23)
Chloride: 102 mEq/L (ref 96–112)
Creatinine, Ser: 0.7 mg/dL (ref 0.50–1.10)
Hemoglobin: 15.6 g/dL — ABNORMAL HIGH (ref 12.0–15.0)
Potassium: 3 mEq/L — ABNORMAL LOW (ref 3.5–5.1)
Sodium: 138 mEq/L (ref 135–145)

## 2012-08-02 MED ORDER — PENICILLIN G BENZATHINE & PROC 1200000 UNIT/2ML IM SUSP
1.2000 10*6.[IU] | Freq: Once | INTRAMUSCULAR | Status: AC
  Administered 2012-08-02: 1.2 10*6.[IU] via INTRAMUSCULAR
  Filled 2012-08-02: qty 2

## 2012-08-02 MED ORDER — POTASSIUM CHLORIDE CRYS ER 20 MEQ PO TBCR
40.0000 meq | EXTENDED_RELEASE_TABLET | Freq: Once | ORAL | Status: AC
  Administered 2012-08-02: 40 meq via ORAL
  Filled 2012-08-02: qty 2

## 2012-08-02 NOTE — ED Provider Notes (Signed)
History  This chart was scribed for Lauren Devora Smitty Cords, MD by Shari Heritage, ED Scribe. The patient was seen in room B17C/B17C. Patient's care was started at 0056.   CSN: [id number]  Arrival date & time 08/01/12  2103   First MD Initiated Contact with Patient 08/02/12 0056      Chief Complaint  Patient presents with  . Sore Throat  . Fever    Patient is a 37 y.o. female presenting with pharyngitis and fever. The history is provided by the patient. No language interpreter was used.  Sore Throat This is a new problem. The current episode started yesterday. The problem occurs constantly. The problem has not changed since onset.Pertinent negatives include no chest pain, no abdominal pain, no headaches and no shortness of breath. The symptoms are aggravated by swallowing. Nothing relieves the symptoms. She has tried nothing for the symptoms.  Fever Max temp prior to arrival:  100 Temp source:  Oral Severity:  Moderate Onset quality:  Gradual Duration:  1 day Timing:  Constant Progression:  Unchanged Chronicity:  New Relieved by:  Nothing Worsened by:  Nothing tried Ineffective treatments:  None tried Associated symptoms: congestion, myalgias, rhinorrhea and sore throat   Associated symptoms: no chest pain, no chills, no confusion, no cough, no diarrhea, no headaches, no nausea and no vomiting      HPI Comments: Lauren [name] is a 37 y.o. female who presents to the Emergency Department complaining of moderate, constant sore throat and fever onset yesterday. Patient states that Tmax at home prior to arrival was 100. There is associated nasal congestion and rhinorrhea. Patient claims that she also developed bilateral hand cramping after having an argument with her son about 9 hours ago. She says that she was lying down in bed when cramping began because she wasn't feeling well due to persistent throat pain. She denies cough, chills, neck pain, neck stiffness, difficulty  swallowing, back pain, visual changes, chest pain, shortness of breath, urgency, frequency, hematuria, dysuria, abdominal pain, nausea, vomiting, diarrhea or headaches. Patient has a medical history of heart murmur and thrombocytosis. She does not smoke or use alcohol.    Past Medical History  Diagnosis Date  . Abnormal Pap smear   . Hx of chlamydia infection   . Fibroid   . Hx: UTI (urinary tract infection)   . Heart murmur     asymptomatic systolic murmur  . Hx of ectopic pregnancy   . Late prenatal care   . Language barrier   . Thrombocytosis     Past Surgical History  Procedure Laterality Date  . Ectopic pregnancy surgery  6years ago  . Salpingectomy      Family History  Problem Relation Age of Onset  . Seizures Son   . Hypertension Son     History  Substance Use Topics  . Smoking status: Never Smoker   . Smokeless tobacco: Not on file  . Alcohol Use: No    OB History   Grav Para Term Preterm Abortions TAB SAB Ect Mult Living   4 3 3  0 1 0 0 1 0 3      Review of Systems  Constitutional: Positive for fever. Negative for chills.  HENT: Positive for congestion, sore throat and rhinorrhea. Negative for trouble swallowing, neck pain and neck stiffness.   Eyes: Negative for visual disturbance.  Respiratory: Negative for cough and shortness of breath.   Cardiovascular: Negative for chest pain.  Gastrointestinal: Negative for nausea, vomiting, abdominal pain, diarrhea  and constipation.  Genitourinary: Negative for urgency, frequency and hematuria.  Musculoskeletal: Positive for myalgias.  Neurological: Negative for headaches.  Psychiatric/Behavioral: Negative for confusion.  All other systems reviewed and are negative.    Allergies  Review of patient's allergies indicates no known allergies.  Home Medications   Current Outpatient Rx  Name  Route  Sig  Dispense  Refill  . acetaminophen (TYLENOL) 325 MG tablet   Oral   Take 650 mg by mouth every 6 (six)  hours as needed for pain.           Triage Vitals: BP 114/65  Pulse 117  Temp(Src) 100.2 F (37.9 C) (Oral)  Resp 20  SpO2 98%  LMP 07/11/2012  Physical Exam  Constitutional: She is oriented to person, place, and time. She appears well-developed and well-nourished.  HENT:  Head: Normocephalic and atraumatic.  Mouth/Throat: Oropharynx is clear and moist and mucous membranes are normal. Mucous membranes are not dry. No oropharyngeal exudate.  No uvula swelling. NO pain with displacement of the trachea.  Eyes: Conjunctivae and EOM are normal. Pupils are equal, round, and reactive to light.  Neck: Normal range of motion. Neck supple. No JVD present. No tracheal deviation present.  No pain with motion of the trachea no swelling of the floor or the mouth or posterior oropharyngeal  Cardiovascular: Normal rate, regular rhythm and normal heart sounds.   No murmur heard. Pulmonary/Chest: Effort normal and breath sounds normal. No stridor. No respiratory distress. She has no wheezes. She has no rales.  Abdominal: Soft. Bowel sounds are normal. She exhibits no distension. There is no tenderness. There is no rebound and no guarding.  Musculoskeletal: Normal range of motion.  Lymphadenopathy:    She has no cervical adenopathy.  Neurological: She is alert and oriented to person, place, and time.  Skin: Skin is warm and dry.  Psychiatric: Thought content normal.    ED Course  Procedures (including critical care time) DIAGNOSTIC STUDIES: Oxygen Saturation is 98% on room air, normal by my interpretation.    COORDINATION OF CARE: 1:00 AM- Patient informed of current plan for treatment and evaluation and agrees with plan at this time.    Labs Reviewed  RAPID STREP SCREEN - Abnormal; Notable for the following:    Streptococcus, Group A Screen (Direct) POSITIVE (*)    All other components within normal limits  POCT I-STAT, CHEM 8 - Abnormal; Notable for the following:    Potassium 3.0 (*)     Glucose, Bld 118 (*)    Hemoglobin 15.6 (*)    All other components within normal limits  POCT PREGNANCY, URINE    No results found.   No diagnosis found.    MDM  Np indication for imaging at this time.  IM PCN given.  Follow up with your PMD for ongoing care     I personally performed the services described in this documentation, which was scribed in my presence. The recorded information has been reviewed and is accurate.     Sham Alviar Smitty Cords, MD 08/02/12 0300

## 2012-08-02 NOTE — ED Notes (Signed)
Pt with sore throat since yesterday a/w nasal congestion; also reports pressure too high today and was having cramping in bil arms

## 2012-08-27 ENCOUNTER — Emergency Department (HOSPITAL_COMMUNITY)
Admission: EM | Admit: 2012-08-27 | Discharge: 2012-08-27 | Disposition: A | Discharge status: Home Health | Payer: No Typology Code available for payment source | Source: Home / Self Care

## 2012-08-27 ENCOUNTER — Encounter (HOSPITAL_COMMUNITY): Payer: Self-pay

## 2012-08-27 NOTE — ED Notes (Signed)
Patient has a history of HTN 

## 2012-09-06 ENCOUNTER — Encounter (HOSPITAL_COMMUNITY): Payer: Self-pay | Admitting: *Deleted

## 2012-09-06 ENCOUNTER — Emergency Department (HOSPITAL_COMMUNITY)
Admission: EM | Admit: 2012-09-06 | Discharge: 2012-09-07 | Disposition: A | Discharge status: Home / Self Care | Payer: No Typology Code available for payment source | Attending: Emergency Medicine | Admitting: Emergency Medicine

## 2012-09-06 DIAGNOSIS — K8020 Calculus of gallbladder without cholecystitis without obstruction: Secondary | ICD-10-CM | POA: Insufficient documentation

## 2012-09-06 DIAGNOSIS — Z8619 Personal history of other infectious and parasitic diseases: Secondary | ICD-10-CM | POA: Insufficient documentation

## 2012-09-06 DIAGNOSIS — Z8742 Personal history of other diseases of the female genital tract: Secondary | ICD-10-CM | POA: Insufficient documentation

## 2012-09-06 DIAGNOSIS — Z3202 Encounter for pregnancy test, result negative: Secondary | ICD-10-CM | POA: Insufficient documentation

## 2012-09-06 DIAGNOSIS — Z87440 Personal history of urinary (tract) infections: Secondary | ICD-10-CM | POA: Insufficient documentation

## 2012-09-06 DIAGNOSIS — K8050 Calculus of bile duct without cholangitis or cholecystitis without obstruction: Secondary | ICD-10-CM

## 2012-09-06 DIAGNOSIS — R112 Nausea with vomiting, unspecified: Secondary | ICD-10-CM | POA: Insufficient documentation

## 2012-09-06 DIAGNOSIS — R011 Cardiac murmur, unspecified: Secondary | ICD-10-CM | POA: Insufficient documentation

## 2012-09-06 NOTE — ED Notes (Signed)
Pt states sudden onset of upper abd pain that began suddenly at 5pm; pt c/o nausea and vomit x 1; pt describes it as "pain"

## 2012-09-07 ENCOUNTER — Other Ambulatory Visit (INDEPENDENT_AMBULATORY_CARE_PROVIDER_SITE_OTHER): Payer: Self-pay | Admitting: Surgery

## 2012-09-07 ENCOUNTER — Emergency Department (HOSPITAL_COMMUNITY): Payer: No Typology Code available for payment source

## 2012-09-07 ENCOUNTER — Telehealth (INDEPENDENT_AMBULATORY_CARE_PROVIDER_SITE_OTHER): Payer: Self-pay

## 2012-09-07 DIAGNOSIS — R109 Unspecified abdominal pain: Secondary | ICD-10-CM

## 2012-09-07 DIAGNOSIS — K8020 Calculus of gallbladder without cholecystitis without obstruction: Secondary | ICD-10-CM

## 2012-09-07 LAB — CBC WITH DIFFERENTIAL/PLATELET
Eosinophils Relative: 4 % (ref 0–5)
Hemoglobin: 13 g/dL (ref 12.0–15.0)
Lymphocytes Relative: 18 % (ref 12–46)
Lymphs Abs: 2.2 10*3/uL (ref 0.7–4.0)
MCV: 86.5 fL (ref 78.0–100.0)
Monocytes Relative: 6 % (ref 3–12)
Platelets: 422 10*3/uL — ABNORMAL HIGH (ref 150–400)
RBC: 4.45 MIL/uL (ref 3.87–5.11)
WBC: 12.6 10*3/uL — ABNORMAL HIGH (ref 4.0–10.5)

## 2012-09-07 LAB — COMPREHENSIVE METABOLIC PANEL
ALT: 37 U/L — ABNORMAL HIGH (ref 0–35)
Alkaline Phosphatase: 146 U/L — ABNORMAL HIGH (ref 39–117)
CO2: 25 mEq/L (ref 19–32)
Calcium: 9.7 mg/dL (ref 8.4–10.5)
GFR calc Af Amer: >90 mL/min (ref >90)
GFR calc non Af Amer: >90 mL/min (ref >90)
Glucose, Bld: 128 mg/dL — ABNORMAL HIGH (ref 70–99)
Potassium: 3.7 mEq/L (ref 3.5–5.1)
Sodium: 136 mEq/L (ref 135–145)

## 2012-09-07 LAB — URINALYSIS, ROUTINE W REFLEX MICROSCOPIC
Bilirubin Urine: NEGATIVE
Ketones, ur: NEGATIVE mg/dL
Nitrite: NEGATIVE
Urobilinogen, UA: 0.2 mg/dL (ref 0.0–1.0)

## 2012-09-07 LAB — URINE MICROSCOPIC-ADD ON

## 2012-09-07 MED ORDER — IBUPROFEN 600 MG PO TABS: 600.0000 mg | ORAL_TABLET | Freq: Four times a day (QID) | ORAL | Status: DC | PRN

## 2012-09-07 MED ORDER — LACTATED RINGERS IV SOLN
INTRAVENOUS | Status: DC
  Administered 2012-09-07: 04:00:00 via INTRAVENOUS

## 2012-09-07 MED ORDER — ONDANSETRON HCL 4 MG/2ML IJ SOLN
4.0000 mg | Freq: Once | INTRAMUSCULAR | Status: AC
  Administered 2012-09-07: 4 mg via INTRAVENOUS
  Filled 2012-09-07: qty 2

## 2012-09-07 MED ORDER — HYDROCODONE-ACETAMINOPHEN 5-325 MG PO TABS: 1.0000 | ORAL_TABLET | Freq: Four times a day (QID) | ORAL | Status: DC | PRN

## 2012-09-07 MED ORDER — KETOROLAC TROMETHAMINE 30 MG/ML IJ SOLN
15.0000 mg | Freq: Once | INTRAMUSCULAR | Status: AC
  Administered 2012-09-07: 15 mg via INTRAVENOUS
  Filled 2012-09-07: qty 1

## 2012-09-07 MED ORDER — HYDROMORPHONE HCL PF 1 MG/ML IJ SOLN: 1.0000 mg | Freq: Once | INTRAMUSCULAR | Status: DC

## 2012-09-07 MED ORDER — LACTATED RINGERS IV BOLUS (SEPSIS)
500.0000 mL | Freq: Once | INTRAVENOUS | Status: AC
  Administered 2012-09-07: 500 mL via INTRAVENOUS

## 2012-09-07 MED ORDER — ONDANSETRON 4 MG PO TBDP: 4.0000 mg | ORAL_TABLET | Freq: Four times a day (QID) | ORAL | Status: DC | PRN

## 2012-09-07 NOTE — ED Notes (Signed)
PT INFORMED OF NEED FOR URINE SAMPLE. WILL CONTINUE TO MONITOR

## 2012-09-07 NOTE — ED Provider Notes (Signed)
History     CSN: [id number]  Arrival date & time 09/06/12  2306   First MD Initiated Contact with Patient 09/07/12 0018      Chief Complaint  Patient presents with  . Abdominal Pain   HPI Lauren [name] is a 37 y.o. female no history of gallbladder or stomach problems presents to the emergency department with a history of worsening epigastric and right upper quadrant pain, this started at about 5:00 after eating, she has had some nausea and associated vomiting times one. She says it is a burning pain that radiates through to her back. It is 10 out of 10 in intensity. Denies any chest pains, shortness of breath fevers or chills.   Past Medical History  Diagnosis Date  . Abnormal Pap smear   . Hx of chlamydia infection   . Fibroid   . Hx: UTI (urinary tract infection)   . Heart murmur     asymptomatic systolic murmur  . Hx of ectopic pregnancy   . Late prenatal care   . Language barrier   . Thrombocytosis     Past Surgical History  Procedure Laterality Date  . Ectopic pregnancy surgery  6years ago  . Salpingectomy      Family History  Problem Relation Age of Onset  . Seizures Son   . Hypertension Son     History  Substance Use Topics  . Smoking status: Never Smoker   . Smokeless tobacco: Not on file  . Alcohol Use: No    OB History   Grav Para Term Preterm Abortions TAB SAB Ect Mult Living   4 3 3  0 1 0 0 1 0 3      Review of Systems At least 10pt or greater review of systems completed and are negative except where specified in the HPI.  Allergies  Review of patient's allergies indicates no known allergies.  Home Medications  No current outpatient prescriptions on file.  BP 190/96  Pulse 89  Temp(Src) 99.1 F (37.3 C) (Oral)  Resp 22  SpO2 100%  LMP 08/25/2012  Breastfeeding? No  Physical Exam  Nursing notes reviewed.  Electronic medical record reviewed. VITAL SIGNS:   Filed Vitals:   09/06/12 2345  BP: 190/96  Pulse: 89  Temp:  99.1 F (37.3 C)  TempSrc: Oral  Resp: 22  SpO2: 100%   CONSTITUTIONAL: Awake, oriented, appears non-toxic HENT: Atraumatic, normocephalic, oral mucosa pink and moist, airway patent. Nares patent without drainage. External ears normal. EYES: Conjunctiva clear, EOMI, PERRLA NECK: Trachea midline, non-tender, supple CARDIOVASCULAR: Normal heart rate, Normal rhythm, No murmurs, rubs, gallops PULMONARY/CHEST: Clear to auscultation, no rhonchi, wheezes, or rales. Symmetrical breath sounds. Non-tender. ABDOMINAL: Non-distended, soft, tenderness to palpation in the right upper quadrant with Murphy sign.  BS normal. NEUROLOGIC: Non-focal, moving all four extremities, no gross sensory or motor deficits. EXTREMITIES: No clubbing, cyanosis, or edema SKIN: Warm, Dry, No erythema, No rash  ED Course  Procedures (including critical care time)  Labs Reviewed  CBC WITH DIFFERENTIAL - Abnormal; Notable for the following:    WBC 12.6 (*)    Platelets 422 (*)    Neutro Abs 9.1 (*)    All other components within normal limits  COMPREHENSIVE METABOLIC PANEL - Abnormal; Notable for the following:    Glucose, Bld 128 (*)    ALT 37 (*)    Alkaline Phosphatase 146 (*)    Total Bilirubin 0.2 (*)    All other components within normal limits  URINALYSIS, ROUTINE W REFLEX MICROSCOPIC - Abnormal; Notable for the following:    Leukocytes, UA SMALL (*)    All other components within normal limits  LIPASE, BLOOD  URINE MICROSCOPIC-ADD ON  POCT PREGNANCY, URINE   US Abdomen Complete  09/07/2012   *RADIOLOGY REPORT*  Clinical Data:  Cholelithiasis, nausea.  COMPLETE ABDOMINAL ULTRASOUND  Comparison:  None.  Findings:  Gallbladder:  Gallstones.  No gallbladder wall thickening. Negative sonographic Murphy's sign.  Common bile duct:  Normal in diameter at 4.5 mm.  Liver:  No focal lesion identified.  Within normal limits in parenchymal echogenicity.  IVC:  Appears normal.  Pancreas:  Poorly visualized due to  limited acoustic windows/bowel gas artifact.  The visualized portions of the head and neck are within normal limits.  Spleen:  Measures 10 cm.  No focal abnormality.  Right Kidney:  Measures 12 cm.  No hydronephrosis or focal abnormality.  Left Kidney:  Measures 11.5 cm.  No hydronephrosis or focal abnormality.  Abdominal aorta:  No aneurysm identified.  IMPRESSION: Gallstones without sonographic evidence for acute cholecystitis.   Original Report Authenticated By: Jearld Lesch, M.D.     1. Biliary colic   2. Cholelithiases   3. Nausea and vomiting       MDM  Lauren [name] is a 37 y.o. female presenting with epigastric pain, bedside ultrasound shows gallstones as well as positive sonographic Murphy sign, some areas of gallbladder wall appears thickened, will order a formal ultrasound of the right upper quadrant suspect acute cholecystitis.  Right upper quadrant ultrasound shows gallstones but no signs of acute cholecystitis per official read. Patient's LFTs are slightly elevated, alkaline phosphatase is slightly elevated.  Patient is completely pain-free after medication, she's been evaluated by surgery (Dr. Ezzard Standing) and both surgery and I feel that she is stable and safe for discharge with outpatient followup for elective cholecystectomy. The patient likely having biliary colic from stones.  We'll discharge her home stable and in good condition with explicit return precautions to return for fevers, worsening pain, intractable nausea vomiting. These instructions have been translated to her by her significant other.  Abdominal exam is benign on recheck, her questions have been answered.         Jones Skene, MD 09/07/12 [phone]

## 2012-09-07 NOTE — Telephone Encounter (Signed)
P/O appt 711/14@4 :10p with Dr. Ezzard Standing Pt aware

## 2012-09-07 NOTE — Consult Note (Signed)
**Note Lauren-Identified via Obfuscation** Re:   Lauren [name] DOB:   02-Jul-1975 MRN:   [id number]  ASSESSMENT AND PLAN: 1.  Symptomatic gall stones  I discussed with the patient and Lauren [name] the indications and risks of gall bladder surgery.  The primary risks of gall bladder surgery include, but are not limited to, bleeding, infection, common bile duct injury, and open surgery.  There is also the risk that the patient may have continued symptoms after surgery.  However, the likelihood of improvement in symptoms and return to the patient's normal status is good. We discussed the typical post-operative recovery course. I tried to answer the patient's questions.  Lauren [name] acted as Nurse, learning disability.    She will call our office to schedule the surgery.  I want to see her in the office before the surgery to clarify any questions.  2.  Vaginal delivery - 01/13/2012 3.  Thrombocytosis, mild  Plts - 422,000 - 09/07/2012  Chief Complaint  Patient presents with  . Abdominal Pain   REFERRING PHYSICIAN:  Dr. Nichola [name], [facility]  HISTORY OF PRESENT ILLNESS: Lauren [name] is a 37 y.o. (DOB: 19-Feb-1976)  hispanic  female whose primary care physician is Lauren [name] and comes to the [facility] today for abdominal pain.  She denies prior abdominal symptoms.  About 5 PM on 09/06/2012, she developed abdominal pain.  By 11 PM she was vomiting.  She [facility] the [facility].  She's had no fever, no prior abdominal surgery except for history ectopic pregnancy (2008).  On Lauren [name] exam, he got a Murphy's sign, but this has resolved with pain meds.  WBC - 12,600, LFT - Alk Phos -146, T. Bili. - 0.2, Lipase - 37 - 09/07/2012 [place] - 09/07/2012 - gallstones   Past Medical History  Diagnosis Date  . Abnormal Pap smear   . Hx of chlamydia infection   . Fibroid   . Hx: UTI (urinary tract infection)   . Heart murmur     asymptomatic systolic murmur  . Hx of ectopic pregnancy   . Late prenatal care   . Language barrier   . Thrombocytosis      Past Surgical History  Procedure Laterality Date  . Ectopic pregnancy surgery  6years ago  . Salpingectomy      Current Facility-Administered Medications  Medication Dose Route Frequency Provider Last Rate Last Dose  . HYDROmorphone (DILAUDID) injection 1 mg  1 mg Intravenous Once Lauren Bonk, [name]      . lactated ringers bolus 500 mL  500 mL Intravenous Once Lauren Bonk, [name]   500 mL at 09/07/12 0233  . lactated ringers infusion   Intravenous Continuous Lauren Bonk, [name]       No current outpatient prescriptions on file.     No Known Allergies  REVIEW OF SYSTEMS: Skin:  No history of rash.  No history of abnormal moles. Infection:  No history of hepatitis or HIV.  No history of MRSA. Neurologic:  No history of stroke.  No history of seizure.  No history of headaches. Cardiac:  No history of hypertension. No history of heart disease.  No history of prior cardiac catheterization.  No history of seeing a cardiologist. Pulmonary:  Does not smoke cigarettes.  No asthma or bronchitis.  No OSA/CPAP.  Endocrine:  No diabetes. No thyroid disease. Gastrointestinal:  No history of stomach disease.  No history of liver disease.  No history of gall bladder disease.  No history of pancreas disease.  No history of  colon disease. Urologic:  No history of kidney stones.  No history of bladder infections. Musculoskeletal:  No history of joint or back disease. Hematologic:  No bleeding disorder.  No history of anemia.  Not anticoagulated. Psycho-social:  The patient is oriented.   The patient has no obvious psychologic or social impairment to understanding our conversation and plan.  SOCIAL and FAMILY HISTORY: From [place]. Single. Not employed. 3 children:  15, 3 and 7 months. Lauren [name], a friend, has acted as Lauren [name].  PHYSICAL EXAM: BP 190/96  Pulse 89  Temp(Src) 98.9 F (37.2 C) (Rectal)  Resp 22  SpO2 100%  LMP 08/25/2012  Breastfeeding? No  General: WN Hispanic F  who is alert and generally healthy appearing.  HEENT: Normal. Pupils equal. Neck: Supple. No mass.  No thyroid mass. Lymph Nodes:  No supraclavicular or cervical nodes. Lungs: Clear to auscultation and symmetric breath sounds. Heart:  RRR. No murmur or rub. Abdomen: Soft. No mass. No tenderness.  Particular attention paid to RUQ.  She had RUQ pain on Lauren [name] exam, but by the time I saw her, this had completely resolved. No hernia. Normal bowel sounds. Rectal: Not done. Extremities:  Good strength and ROM  in upper and lower extremities. Neurologic:  Grossly intact to motor and sensory function. Psychiatric: Has normal mood and affect. Behavior is normal.   DATA REVIEWED: Epic notes.  Lauren Kin, [name],  [facility] [address] [place]  [address]   [place], [place]    [address] Phone:  [phone] FAX:  [phone]

## 2012-09-11 ENCOUNTER — Telehealth (INDEPENDENT_AMBULATORY_CARE_PROVIDER_SITE_OTHER): Payer: Self-pay

## 2012-09-11 NOTE — Telephone Encounter (Signed)
Lauren [name] informed patient she has a pre-op visit with Dr. Ezzard Standing 10/17/12@12 :15

## 2012-10-03 ENCOUNTER — Telehealth (INDEPENDENT_AMBULATORY_CARE_PROVIDER_SITE_OTHER): Payer: Self-pay

## 2012-10-03 NOTE — Telephone Encounter (Signed)
Patient arrived to today 4 pm with her interpreter  to see Dr. Ezzard Standing for her pre op appointment. Patient had missed understood about her pre op  appointment  Date and time Which is scheduled for 10/17/12 @ 12:15 . Patients  post appointment is scheduled for 11/02/12 @ 4:10 . Today date  Is 10/03/12 @ 4 pm therefore patient had misunderstanding with the month she was scheduled to see Dr. Ezzard Standing. Patient verbalizes date and time for pre op and post op appointments

## 2012-10-11 ENCOUNTER — Encounter (HOSPITAL_COMMUNITY): Payer: Self-pay | Admitting: Respiratory Therapy

## 2012-10-15 ENCOUNTER — Encounter (HOSPITAL_COMMUNITY): Payer: Self-pay

## 2012-10-15 ENCOUNTER — Encounter (HOSPITAL_COMMUNITY)
Admission: RE | Admit: 2012-10-15 | Discharge: 2012-10-15 | Disposition: A | Discharge status: Home / Self Care | Payer: No Typology Code available for payment source | Source: Ambulatory Visit | Attending: Surgery | Admitting: Surgery

## 2012-10-15 HISTORY — DX: Headache: R51

## 2012-10-15 LAB — CBC
Hemoglobin: 12.9 g/dL (ref 12.0–15.0)
MCHC: 33.2 g/dL (ref 30.0–36.0)
WBC: 9.8 10*3/uL (ref 4.0–10.5)

## 2012-10-15 LAB — COMPREHENSIVE METABOLIC PANEL
ALT: 62 U/L — ABNORMAL HIGH (ref 0–35)
BUN: 17 mg/dL (ref 6–23)
CO2: 29 mEq/L (ref 19–32)
Calcium: 9.3 mg/dL (ref 8.4–10.5)
Creatinine, Ser: 0.59 mg/dL (ref 0.50–1.10)
GFR calc Af Amer: >90 mL/min (ref >90)
GFR calc non Af Amer: >90 mL/min (ref >90)
Glucose, Bld: 89 mg/dL (ref 70–99)
Total Protein: 8 g/dL (ref 6.0–8.3)

## 2012-10-15 LAB — HCG, SERUM, QUALITATIVE: Preg, Serum: NEGATIVE

## 2012-10-15 LAB — NO BLOOD PRODUCTS

## 2012-10-15 MED ORDER — CHLORHEXIDINE GLUCONATE 4 % EX LIQD: 1.0000 "application " | Freq: Once | CUTANEOUS | Status: DC

## 2012-10-15 NOTE — Pre-Procedure Instructions (Signed)
Hortense Prestegui-Bedolla  10/15/2012   Your procedure is scheduled on:  Fri, June 27 @ 10:45 AM  Report to [facility] at 8:45 AM.  Call this number if you have problems the morning of surgery: [phone]   Remember:   Do not eat food or drink liquids after midnight.      Do not wear jewelry, make-up or nail polish.  Do not wear lotions, powders, or perfumes. You may wear deodorant.  Do not shave 48 hours prior to surgery.   Do not bring valuables to the hospital.  [facility] is not responsible                   for any belongings or valuables.  Contacts, dentures or bridgework may not be worn into surgery.  Leave suitcase in the car. After surgery it may be brought to your room.  For patients admitted to the hospital, checkout time is 11:00 AM the day of  discharge.   Patients discharged the day of surgery will not be allowed to drive  home.    Special Instructions: Shower using CHG 2 nights before surgery and the night before surgery.  If you shower the day of surgery use CHG.  Use special wash - you have one bottle of CHG for all showers.  You should use approximately 1/3 of the bottle for each shower.   Please read over the following fact sheets that you were given: Pain Booklet, Coughing and Deep Breathing, MRSA Information and Surgical Site Infection Prevention

## 2012-10-17 ENCOUNTER — Ambulatory Visit (INDEPENDENT_AMBULATORY_CARE_PROVIDER_SITE_OTHER): Payer: PRIVATE HEALTH INSURANCE | Admitting: Surgery

## 2012-10-17 VITALS — BP 128/76 | HR 62 | Resp 18 | Ht 64.0 in | Wt 160.0 lb

## 2012-10-17 DIAGNOSIS — K829 Disease of gallbladder, unspecified: Secondary | ICD-10-CM

## 2012-10-17 NOTE — Progress Notes (Signed)
Re: Lauren [name]  DOB: 03/19/1976  MRN: [id number]  ASSESSMENT AND PLAN:  1. Symptomatic gall stones   I discussed with the patient and Mr. Suarez the indications and risks of gall bladder surgery. The primary risks of gall bladder surgery include, but are not limited to, bleeding, infection, common bile duct injury, and open surgery. There is also the risk that the patient may have continued symptoms after surgery. However, the likelihood of improvement in symptoms and return to the patient's normal status is good. We discussed the typical post-operative recovery course. I tried to answer the patient's questions.   I gave her a Spanish book on gall bladder disease.  Ruth acted as interpreter in our office.  For surgery 10/19/2012 at [facility].  2. Vaginal delivery - 01/13/2012  3. Thrombocytosis, mild   Plts - 422,000 - 09/07/2012   Chief Complaint   Patient presents with   .  Abdominal Pain    REFERRING PHYSICIAN: Dr. J. Bonk, [facility]   HISTORY OF PRESENT ILLNESS:  Lauren [name] is a 36 y.o. (DOB: 08/24/1975) hispanic female whose primary care physician is Default, Provider, MD and came to the [facility] 09/07/2012 for abdominal pain.   She's had no further symptoms since I saw her.  She is doing well.  Has gone through her pre op.  I answered questions about surgery.  We also talked about her going home the same day.  History of abdominal pain: She denies prior abdominal symptoms. About 5 PM on 09/06/2012, she developed abdominal pain. By 11 PM she was vomiting. She came to the [facility]. She's had no fever, no prior abdominal surgery except for history ectopic pregnancy (2008).   On Dr. Bonk's exam, he got a Murphy's sign, but this has resolved with pain meds.  WBC - 12,600, LFT - Alk Phos -146, T. Bili. - 0.2, Lipase - 37 - 09/07/2012  US - 09/07/2012 - gallstones   Past Medical History   Diagnosis  Date   .  Abnormal Pap smear    .  Hx of chlamydia infection    .   Fibroid    .  Hx: UTI (urinary tract infection)    .  Heart murmur      asymptomatic systolic murmur   .  Hx of ectopic pregnancy    .  Late prenatal care    .  Language barrier    .  Thrombocytosis      Past Surgical History   Procedure  Laterality  Date   .  Ectopic pregnancy surgery   6years ago   .  Salpingectomy      Current Facility-Administered Medications   Medication  Dose  Route  Frequency  Provider  Last Rate  Last Dose   .  HYDROmorphone (DILAUDID) injection 1 mg  1 mg  Intravenous  Once  John-Adam Bonk, MD     .  lactated ringers bolus 500 mL  500 mL  Intravenous  Once  John-Adam Bonk, MD   500 mL at 09/07/12 0233   .  lactated ringers infusion   Intravenous  Continuous  John-Adam Bonk, MD      No current outpatient prescriptions on file.   No Known Allergies   REVIEW OF SYSTEMS:  Skin: No history of rash. No history of abnormal moles.  Infection: No history of hepatitis or HIV. No history of MRSA.  Neurologic: No history of stroke. No history of seizure. No history of headaches.  Cardiac:   No history of hypertension. No history of heart disease.  No history of seeing a cardiologist.  Pulmonary: Does not smoke cigarettes. No asthma or bronchitis. No OSA/CPAP.  Endocrine: No diabetes. No thyroid disease.  Gastrointestinal: No history of stomach disease. No history of liver disease. No history of gall bladder disease. No history of pancreas disease. No history of colon disease.  Urologic: No history of kidney stones. No history of bladder infections.  Musculoskeletal: No history of joint or back disease.  Hematologic: No bleeding disorder. No history of anemia. Not anticoagulated.  Psycho-social: The patient is oriented. The patient has no obvious psychologic or social impairment to understanding our conversation and plan.   SOCIAL and FAMILY HISTORY:  From [place].  Single.  Not employed.  3 children: 15, 3 and 7 months.  Jorge Suarez, a friend, has acted as  interpreter in the ER.  PHYSICAL EXAM:  BP 128/76  Pulse 62  Resp 18  Ht 5' 4" (1.626 m)  Wt 160 lb (72.576 kg)  BMI 27.45 kg/m2  LMP 10/11/2012  General: WN Hispanic F who is alert and generally healthy appearing.  HEENT: Normal. Pupils equal.  Neck: Supple. No mass. No thyroid mass.  Lymph Nodes: No supraclavicular or cervical nodes.  Lungs: Clear to auscultation and symmetric breath sounds.  Heart: RRR. No murmur or rub.  Abdomen: Soft. No mass. No tenderness.  No scars. Rectal: Not done.  Extremities: Good strength and ROM in upper and lower extremities.  Neurologic: Grossly intact to motor and sensory function.  Psychiatric: Has normal mood and affect. Behavior is normal.   DATA REVIEWED:  Epic notes.   Tank Difiore, MD, FACS  [facility]  [address]  [place], [place] [address]  Phone: [phone] FAX: [phone]  

## 2012-10-18 MED ORDER — CEFAZOLIN SODIUM-DEXTROSE 2-3 GM-% IV SOLR
2.0000 g | INTRAVENOUS | Status: AC
  Administered 2012-10-19: 2 g via INTRAVENOUS
  Filled 2012-10-18: qty 50

## 2012-10-19 ENCOUNTER — Ambulatory Visit (HOSPITAL_COMMUNITY): Payer: No Typology Code available for payment source | Admitting: Anesthesiology

## 2012-10-19 ENCOUNTER — Encounter (HOSPITAL_COMMUNITY)
Admission: RE | Disposition: A | Discharge status: Home / Self Care | Payer: Self-pay | Source: Ambulatory Visit | Attending: Surgery

## 2012-10-19 ENCOUNTER — Ambulatory Visit (HOSPITAL_COMMUNITY): Payer: No Typology Code available for payment source

## 2012-10-19 ENCOUNTER — Ambulatory Visit (HOSPITAL_COMMUNITY)
Admission: RE | Admit: 2012-10-19 | Discharge: 2012-10-19 | Disposition: A | Discharge status: Home / Self Care | Payer: No Typology Code available for payment source | Source: Ambulatory Visit | Attending: Surgery | Admitting: Surgery

## 2012-10-19 ENCOUNTER — Encounter (HOSPITAL_COMMUNITY): Payer: Self-pay | Admitting: *Deleted

## 2012-10-19 ENCOUNTER — Encounter (HOSPITAL_COMMUNITY): Payer: Self-pay | Admitting: Anesthesiology

## 2012-10-19 DIAGNOSIS — Z8619 Personal history of other infectious and parasitic diseases: Secondary | ICD-10-CM | POA: Insufficient documentation

## 2012-10-19 DIAGNOSIS — K829 Disease of gallbladder, unspecified: Secondary | ICD-10-CM

## 2012-10-19 DIAGNOSIS — K8010 Calculus of gallbladder with chronic cholecystitis without obstruction: Secondary | ICD-10-CM

## 2012-10-19 DIAGNOSIS — D473 Essential (hemorrhagic) thrombocythemia: Secondary | ICD-10-CM | POA: Insufficient documentation

## 2012-10-19 DIAGNOSIS — R011 Cardiac murmur, unspecified: Secondary | ICD-10-CM | POA: Insufficient documentation

## 2012-10-19 DIAGNOSIS — K824 Cholesterolosis of gallbladder: Secondary | ICD-10-CM

## 2012-10-19 DIAGNOSIS — D219 Benign neoplasm of connective and other soft tissue, unspecified: Secondary | ICD-10-CM | POA: Insufficient documentation

## 2012-10-19 DIAGNOSIS — Z87440 Personal history of urinary (tract) infections: Secondary | ICD-10-CM | POA: Insufficient documentation

## 2012-10-19 HISTORY — PX: CHOLECYSTECTOMY: SHX55

## 2012-10-19 SURGERY — LAPAROSCOPIC CHOLECYSTECTOMY WITH INTRAOPERATIVE CHOLANGIOGRAM
Anesthesia: General | Site: Abdomen | Wound class: Clean Contaminated

## 2012-10-19 MED ORDER — MIDAZOLAM HCL 5 MG/5ML IJ SOLN
INTRAMUSCULAR | Status: DC | PRN
  Administered 2012-10-19: 2 mg via INTRAVENOUS

## 2012-10-19 MED ORDER — BUPIVACAINE-EPINEPHRINE PF 0.25-1:200000 % IJ SOLN
INTRAMUSCULAR | Status: AC
  Filled 2012-10-19: qty 30

## 2012-10-19 MED ORDER — FENTANYL CITRATE 0.05 MG/ML IJ SOLN
INTRAMUSCULAR | Status: DC | PRN
  Administered 2012-10-19: 50 ug via INTRAVENOUS
  Administered 2012-10-19: 100 ug via INTRAVENOUS

## 2012-10-19 MED ORDER — ONDANSETRON HCL 4 MG/2ML IJ SOLN
INTRAMUSCULAR | Status: DC | PRN
  Administered 2012-10-19: 4 mg via INTRAVENOUS

## 2012-10-19 MED ORDER — NEOSTIGMINE METHYLSULFATE 1 MG/ML IJ SOLN
INTRAMUSCULAR | Status: DC | PRN
  Administered 2012-10-19: 3 mg via INTRAVENOUS

## 2012-10-19 MED ORDER — BUPIVACAINE HCL 0.25 % IJ SOLN
INTRAMUSCULAR | Status: DC | PRN
  Administered 2012-10-19: 20 mL

## 2012-10-19 MED ORDER — LACTATED RINGERS IV SOLN
INTRAVENOUS | Status: DC | PRN
  Administered 2012-10-19: 10:00:00 via INTRAVENOUS

## 2012-10-19 MED ORDER — LIDOCAINE HCL (CARDIAC) 20 MG/ML IV SOLN
INTRAVENOUS | Status: DC | PRN
  Administered 2012-10-19: 80 mg via INTRAVENOUS

## 2012-10-19 MED ORDER — CIPROFLOXACIN-DEXAMETHASONE 0.3-0.1 % OT SUSP
OTIC | Status: AC
  Filled 2012-10-19: qty 7.5

## 2012-10-19 MED ORDER — IOHEXOL 300 MG/ML  SOLN
INTRAMUSCULAR | Status: DC | PRN
  Administered 2012-10-19: 12:00:00

## 2012-10-19 MED ORDER — ROCURONIUM BROMIDE 100 MG/10ML IV SOLN
INTRAVENOUS | Status: DC | PRN
  Administered 2012-10-19: 50 mg via INTRAVENOUS

## 2012-10-19 MED ORDER — 0.9 % SODIUM CHLORIDE (POUR BTL) OPTIME
TOPICAL | Status: DC | PRN
  Administered 2012-10-19: 1000 mL

## 2012-10-19 MED ORDER — SODIUM CHLORIDE 0.9 % IR SOLN
Status: DC | PRN
  Administered 2012-10-19: 1000 mL

## 2012-10-19 MED ORDER — GLYCOPYRROLATE 0.2 MG/ML IJ SOLN
INTRAMUSCULAR | Status: DC | PRN
  Administered 2012-10-19: 0.4 mg via INTRAVENOUS

## 2012-10-19 MED ORDER — LACTATED RINGERS IV SOLN
INTRAVENOUS | Status: DC
  Administered 2012-10-19: 10:00:00 via INTRAVENOUS

## 2012-10-19 MED ORDER — PROPOFOL 10 MG/ML IV BOLUS
INTRAVENOUS | Status: DC | PRN
  Administered 2012-10-19: 200 mg via INTRAVENOUS

## 2012-10-19 MED ORDER — MUPIROCIN 2 % EX OINT
TOPICAL_OINTMENT | CUTANEOUS | Status: AC
  Administered 2012-10-19: 1 via NASAL
  Filled 2012-10-19: qty 22

## 2012-10-19 SURGICAL SUPPLY — 49 items
APPLIER CLIP 5 13 M/L LIGAMAX5 (MISCELLANEOUS) ×2
APPLIER CLIP ROT 10 11.4 M/L (STAPLE)
BLADE SURG ROTATE 9660 (MISCELLANEOUS) IMPLANT
CANISTER SUCTION 2500CC (MISCELLANEOUS) ×2 IMPLANT
CHLORAPREP W/TINT 26ML (MISCELLANEOUS) ×2 IMPLANT
CHOLANGIOGRAM CATH TAUT (CATHETERS) ×2 IMPLANT
CLIP APPLIE 5 13 M/L LIGAMAX5 (MISCELLANEOUS) ×1 IMPLANT
CLIP APPLIE ROT 10 11.4 M/L (STAPLE) IMPLANT
CLOTH BEACON ORANGE TIMEOUT ST (SAFETY) ×2 IMPLANT
COVER MAYO STAND STRL (DRAPES) ×2 IMPLANT
COVER SURGICAL LIGHT HANDLE (MISCELLANEOUS) ×2 IMPLANT
DECANTER SPIKE VIAL GLASS SM (MISCELLANEOUS) IMPLANT
DERMABOND ADVANCED (GAUZE/BANDAGES/DRESSINGS) ×1
DERMABOND ADVANCED .7 DNX12 (GAUZE/BANDAGES/DRESSINGS) ×1 IMPLANT
DRAPE C-ARM 42X72 X-RAY (DRAPES) ×2 IMPLANT
DRAPE UTILITY 15X26 W/TAPE STR (DRAPE) ×4 IMPLANT
ELECT REM PT RETURN 9FT ADLT (ELECTROSURGICAL) ×2
ELECTRODE REM PT RTRN 9FT ADLT (ELECTROSURGICAL) ×1 IMPLANT
FILTER SMOKE EVAC LAPAROSHD (FILTER) ×2 IMPLANT
GLOVE BIO SURGEON STRL SZ7 (GLOVE) ×2 IMPLANT
GLOVE BIOGEL PI IND STRL 6.5 (GLOVE) ×1 IMPLANT
GLOVE BIOGEL PI IND STRL 7.0 (GLOVE) ×1 IMPLANT
GLOVE BIOGEL PI INDICATOR 6.5 (GLOVE) ×1
GLOVE BIOGEL PI INDICATOR 7.0 (GLOVE) ×1
GLOVE EUDERMIC 7 POWDERFREE (GLOVE) ×2 IMPLANT
GLOVE SURG SIGNA 7.5 PF LTX (GLOVE) ×2 IMPLANT
GLOVE SURG SS PI 7.0 STRL IVOR (GLOVE) ×2 IMPLANT
GOWN STRL NON-REIN LRG LVL3 (GOWN DISPOSABLE) ×4 IMPLANT
GOWN STRL REIN XL XLG (GOWN DISPOSABLE) ×4 IMPLANT
IV CATH 14GX2 1/4 (CATHETERS) ×2 IMPLANT
KIT BASIN OR (CUSTOM PROCEDURE TRAY) ×2 IMPLANT
KIT ROOM TURNOVER OR (KITS) ×2 IMPLANT
NS IRRIG 1000ML POUR BTL (IV SOLUTION) ×2 IMPLANT
PAD ARMBOARD 7.5X6 YLW CONV (MISCELLANEOUS) ×2 IMPLANT
POUCH SPECIMEN RETRIEVAL 10MM (ENDOMECHANICALS) ×2 IMPLANT
SCISSORS LAP 5X35 DISP (ENDOMECHANICALS) IMPLANT
SET IRRIG TUBING LAPAROSCOPIC (IRRIGATION / IRRIGATOR) ×2 IMPLANT
SLEEVE ENDOPATH XCEL 5M (ENDOMECHANICALS) ×2 IMPLANT
SPECIMEN JAR SMALL (MISCELLANEOUS) ×2 IMPLANT
STOPCOCK 4 WAY LG BORE MALE ST (IV SETS) ×2 IMPLANT
SUT VIC AB 5-0 PS2 18 (SUTURE) ×2 IMPLANT
TOWEL OR 17X24 6PK STRL BLUE (TOWEL DISPOSABLE) ×2 IMPLANT
TOWEL OR 17X26 10 PK STRL BLUE (TOWEL DISPOSABLE) ×2 IMPLANT
TRAY LAPAROSCOPIC (CUSTOM PROCEDURE TRAY) ×2 IMPLANT
TROCAR XCEL BLUNT TIP 100MML (ENDOMECHANICALS) ×2 IMPLANT
TROCAR XCEL NON-BLD 11X100MML (ENDOMECHANICALS) IMPLANT
TROCAR XCEL NON-BLD 5MMX100MML (ENDOMECHANICALS) ×2 IMPLANT
TUBING EXTENTION W/L.L. (IV SETS) ×2 IMPLANT
WATER STERILE IRR 1000ML POUR (IV SOLUTION) IMPLANT

## 2012-10-19 NOTE — Interval H&P Note (Signed)
History and Physical Interval Note:  10/19/2012 10:15 AM  Lauren [name]  has presented today for surgery, with the diagnosis of GALL STONES   The various methods of treatment have been discussed with the patient and family.   [employer] acted as Nurse, learning disability.  Her husband, Donald Pore, is at the bedside.  She works at [employer].  She will be out of work about 1 week.  After consideration of risks, benefits and other options for treatment, the patient has consented to  Procedure(s): LAPAROSCOPIC CHOLECYSTECTOMY WITH INTRAOPERATIVE CHOLANGIOGRAM (N/A) as a surgical intervention .    The patient's history has been reviewed, patient examined, no change in status, stable for surgery.  I have reviewed the patient's chart and labs.  Questions were answered to the patient's satisfaction.     [name]

## 2012-10-19 NOTE — Anesthesia Postprocedure Evaluation (Signed)
  Anesthesia Post-op Note  Patient: Lauren [name]  Procedure(s) Performed: Procedure(s): LAPAROSCOPIC CHOLECYSTECTOMY WITH INTRAOPERATIVE CHOLANGIOGRAM (N/A)  Patient Location: PACU  Anesthesia Type:General  Level of Consciousness: awake, alert  and oriented  Airway and Oxygen Therapy: Patient Spontanous Breathing and Patient connected to nasal cannula oxygen  Post-op Pain: mild  Post-op Assessment: Post-op Vital signs reviewed  Post-op Vital Signs: Reviewed  Complications: No apparent anesthesia complications

## 2012-10-19 NOTE — Preoperative (Signed)
Beta Blockers   Reason not to administer Beta Blockers:Not Applicable 

## 2012-10-19 NOTE — Transfer of Care (Signed)
Immediate Anesthesia Transfer of Care Note  Patient: Lauren [name]  Procedure(s) Performed: Procedure(s): LAPAROSCOPIC CHOLECYSTECTOMY WITH INTRAOPERATIVE CHOLANGIOGRAM (N/A)  Patient Location: PACU  Anesthesia Type:General  Level of Consciousness: awake  Airway & Oxygen Therapy: Patient Spontanous Breathing and Patient connected to nasal cannula oxygen  Post-op Assessment: Report given to PACU RN and Post -op Vital signs reviewed and stable  Post vital signs: Reviewed and stable  Complications: No apparent anesthesia complications

## 2012-10-19 NOTE — Op Note (Signed)
10/19/2012  11:45 AM  PATIENT:  Lauren [name], 37 y.o., female, MRN: [id number]  PREOP DIAGNOSIS:  GALLSTONES   POSTOP DIAGNOSIS:   Chronic cholecystitis, cholelithiasis  PROCEDURE:   Procedure(s):  LAPAROSCOPIC CHOLECYSTECTOMY WITH INTRAOPERATIVE CHOLANGIOGRAM  SURGEON:   Ovidio Kin, M.D.  Threasa HeadsEdythe Lynn, M.D.  ANESTHESIA:   general  Anesthesiologist: Kerby Nora, MD CRNA: Danise Edge Lowder, CRNA; Ellin Goodie, CRNA  General  ASA: @asa @  EBL:  minimal  ml  BLOOD ADMINISTERED: none  DRAINS: none   LOCAL MEDICATIONS USED:   30 cc 1/4% marcaine  SPECIMEN:   Gall bladder  COUNTS CORRECT:  YES  INDICATIONS FOR PROCEDURE:  Lauren [name] is a 37 y.o. (DOB: Jan 24, 1976) hispanic  female whose primary care physician is Default, Provider, MD and comes for cholecystectomy.   The indications and risks of the gall bladder surgery were explained to the patient.  The risks include, but are not limited to, infection, bleeding, common bile duct injury and open surgery.  SURGERY:  The patient was taken to room #2 at [facility].  The abdomen was prepped with chloroprep.  The patient was given 2 gm Ancef at the beginning of the operation.   A time out was held and the surgical checklist run.   An infraumbilical incision was made into the abdominal cavity.  A 12 mm Hasson trocar was inserted into the abdominal cavity through the infraumbilical incision and secured with a 0 Vicryl suture.  Three additional trocars were inserted: a 5 mm trocar in the sub-xiphoid location, a 5 mm trocar in the right mid subcostal area, and a 5 mm trocar in the right lateral subcostal area.   The abdomen was explored and the liver, stomach, and bowel that could be seen were unremarkable.   The gall bladder was identified, grasped, and rotated cephalad.  Disssection was carried down to the gall bladder/cystic duct junction and the cystic duct isolated.  A clip was placed on  the gall bladder side of the cystic duct.   An intra-operative cholangiogram was shot.   The intra-operative cholangiogram was shot using a cut off Taut catheter placed through a 14 gauge angiocath in the RUQ.  The Taut catheter was inserted in the cut cystic duct and secured with an endoclip.  A cholangiogram was shot with 10 cc of 1/2 strength Omnipaque.  Using fluoroscopy, the cholangiogram showed the flow of contrast into the common bile duct, up the hepatic radicals, and into the duodenum.  There was no mass or obstruction.  This was a normal intra-operative cholangiogram.   The Taut catheter was removed.  The cystic duct was tripley endoclipped and the cystic artery was identified and clipped.  The gall bladder was bluntly and sharpley dissected from the gall bladder bed.   After the gall bladder was removed from the liver, the gall bladder bed and Triangle of [place] were inspected.  There was no bleeding or bile leak.  The gall bladder was placed in a endocatch bag and delivered through the umbilicus.  The abdomen was irrigated with 1000 cc saline.   The trocars were then removed.  I infiltrated 30cc of 1/4% Marcaine into the incisions.  The umbilical port closed with a 0 Vicryl suture and the skin closed with 5-0 vicryl.  The skin was painted with Dermabond.  The patient's sponge and needle count were correct.  The patient was transported to the RR in good condition.  Ovidio Kin, MD, [facility]  [facility] Pager: [phone] Office phone:  [phone]

## 2012-10-19 NOTE — Anesthesia Preprocedure Evaluation (Signed)

## 2012-10-19 NOTE — H&P (View-Only) (Signed)
**Note Lauren-Identified via Obfuscation** Re: Lauren [name]  DOB: 09-24-1975  MRN: [id number]  ASSESSMENT AND PLAN:  1. Symptomatic gall stones   I discussed with the patient and Lauren [name] the indications and risks of gall bladder surgery. The primary risks of gall bladder surgery include, but are not limited to, bleeding, infection, common bile duct injury, and open surgery. There is also the risk that the patient may have continued symptoms after surgery. However, the likelihood of improvement in symptoms and return to the patient's normal status is good. We discussed the typical post-operative recovery course. I tried to answer the patient's questions.   I gave her a Spanish book on gall bladder disease.  Lauren [name] acted as Equities trader in our office.  For surgery 10/19/2012 at [facility].  2. Vaginal delivery - 01/13/2012  3. Thrombocytosis, mild   Plts - 422,000 - 09/07/2012   Chief Complaint   Patient presents with   .  Abdominal Pain    REFERRING PHYSICIAN: Dr. Nichola [name], [facility]   HISTORY OF PRESENT ILLNESS:  Lauren [name] is a 37 y.o. (DOB: [birth year]) hispanic female whose primary care physician is Lauren [name] and came to the [facility] 09/07/2012 for abdominal pain.   She's had no further symptoms since I saw her.  She is doing well.  Has gone through her pre op.  I answered questions about surgery.  We also talked about her going home the same day.  History of abdominal pain: She denies prior abdominal symptoms. About 5 PM on 09/06/2012, she developed abdominal pain. By 11 PM she was vomiting. She came to the [facility]. She's had no fever, no prior abdominal surgery except for history ectopic pregnancy (2008).   On Lauren [name] exam, he got a Murphy's sign, but this has resolved with pain meds.  WBC - 12,600, LFT - Alk Phos -146, T. Bili. - 0.2, Lipase - 37 - 09/07/2012  [place] - 09/07/2012 - gallstones   Past Medical History   Diagnosis  Date   .  Abnormal Pap smear    .  Hx of chlamydia infection    .   Fibroid    .  Hx: UTI (urinary tract infection)    .  Heart murmur      asymptomatic systolic murmur   .  Hx of ectopic pregnancy    .  Late prenatal care    .  Language barrier    .  Thrombocytosis      Past Surgical History   Procedure  Laterality  Date   .  Ectopic pregnancy surgery   6years ago   .  Salpingectomy      Current Facility-Administered Medications   Medication  Dose  Route  Frequency  Provider  Last Rate  Last Dose   .  HYDROmorphone (DILAUDID) injection 1 mg  1 mg  Intravenous  Once  Lauren Bonk, [name]     .  lactated ringers bolus 500 mL  500 mL  Intravenous  Once  Lauren Bonk, [name]   500 mL at 09/07/12 0233   .  lactated ringers infusion   Intravenous  Continuous  Lauren Bonk, [name]      No current outpatient prescriptions on file.   No Known Allergies   REVIEW OF SYSTEMS:  Skin: No history of rash. No history of abnormal moles.  Infection: No history of hepatitis or HIV. No history of MRSA.  Neurologic: No history of stroke. No history of seizure. No history of headaches.  Cardiac:  No history of hypertension. No history of heart disease.  No history of seeing a cardiologist.  Pulmonary: Does not smoke cigarettes. No asthma or bronchitis. No OSA/CPAP.  Endocrine: No diabetes. No thyroid disease.  Gastrointestinal: No history of stomach disease. No history of liver disease. No history of gall bladder disease. No history of pancreas disease. No history of colon disease.  Urologic: No history of kidney stones. No history of bladder infections.  Musculoskeletal: No history of joint or back disease.  Hematologic: No bleeding disorder. No history of anemia. Not anticoagulated.  Psycho-social: The patient is oriented. The patient has no obvious psychologic or social impairment to understanding our conversation and plan.   SOCIAL and FAMILY HISTORY:  From [place].  Single.  Not employed.  3 children: 15, 3 and 7 months.  Lauren [name], a friend, has acted as  interpreter in the ER.  PHYSICAL EXAM:  BP 128/76  Pulse 62  Resp 18  Ht 5\' 4"  (1.626 m)  Wt 160 lb (72.576 kg)  BMI 27.45 kg/m2  LMP 10/11/2012  General: WN Hispanic F who is alert and generally healthy appearing.  HEENT: Normal. Pupils equal.  Neck: Supple. No mass. No thyroid mass.  Lymph Nodes: No supraclavicular or cervical nodes.  Lungs: Clear to auscultation and symmetric breath sounds.  Heart: RRR. No murmur or rub.  Abdomen: Soft. No mass. No tenderness.  No scars. Rectal: Not done.  Extremities: Good strength and ROM in upper and lower extremities.  Neurologic: Grossly intact to motor and sensory function.  Psychiatric: Has normal mood and affect. Behavior is normal.   DATA REVIEWED:  Epic notes.   Lauren Kin, [name], [facility]  [address]  [place], [place] [address]  Phone: [phone] FAX: [phone]

## 2012-10-23 ENCOUNTER — Encounter (HOSPITAL_COMMUNITY): Payer: Self-pay | Admitting: Surgery

## 2012-11-02 ENCOUNTER — Ambulatory Visit (INDEPENDENT_AMBULATORY_CARE_PROVIDER_SITE_OTHER): Payer: PRIVATE HEALTH INSURANCE | Admitting: Surgery

## 2012-11-02 VITALS — BP 124/72 | HR 76 | Temp 98.0°F | Resp 18 | Ht 63.0 in | Wt 159.0 lb

## 2012-11-02 DIAGNOSIS — K829 Disease of gallbladder, unspecified: Secondary | ICD-10-CM

## 2012-11-02 NOTE — Progress Notes (Signed)
Re: Lauren [name]  DOB: 1976-02-01  MRN: [id number]  ASSESSMENT AND PLAN:  1. Symptomatic gall stones   [name] acted as interpreter in our office.  Lap chole - 10/19/2012 at [facility].  She has done well.  Return PRN 2. Vaginal delivery - 01/13/2012  3. Thrombocytosis, mild   Plts - 422,000 - 09/07/2012   Chief Complaint   Patient presents with   .  Abdominal Pain    REFERRING PHYSICIAN: Dr. Nichola Sizer, [facility]   HISTORY OF PRESENT ILLNESS:  Lauren [name] is a 37 y.o. (DOB: 06/02/1975) hispanic female whose primary care physician is Default, Provider, MD.  She is doing well post op.  [name] was in the room as Nurse, learning disability.  History of abdominal pain: She denies prior abdominal symptoms. About 5 PM on 09/06/2012, she developed abdominal pain. By 11 PM she was vomiting. She came to the [facility]. She's had no fever, no prior abdominal surgery except for history ectopic pregnancy (2008).   On Dr. Hoyt Koch exam, he got a Murphy's sign, but this has resolved with pain meds.  WBC - 12,600, LFT - Alk Phos -146, T. Bili. - 0.2, Lipase - 37 - 09/07/2012  [place] - 09/07/2012 - gallstones   Past Medical History   Diagnosis  Date   .  Abnormal Pap smear    .  Hx of chlamydia infection    .  Fibroid    .  Hx: UTI (urinary tract infection)    .  Heart murmur      asymptomatic systolic murmur   .  Hx of ectopic pregnancy    .  Late prenatal care    .  Language barrier    .  Thrombocytosis      Past Surgical History   Procedure  Laterality  Date   .  Ectopic pregnancy surgery   6years ago   .  Salpingectomy      Current Facility-Administered Medications   Medication  Dose  Route  Frequency  Provider  Last Rate  Last Dose   .  HYDROmorphone (DILAUDID) injection 1 mg  1 mg  Intravenous  Once  John-Adam Bonk, MD     .  lactated ringers bolus 500 mL  500 mL  Intravenous  Once  John-Adam Bonk, MD   500 mL at 09/07/12 0233   .  lactated ringers infusion   Intravenous  Continuous   John-Adam Bonk, MD      No current outpatient prescriptions on file.   No Known Allergies   REVIEW OF SYSTEMS:  Negative  SOCIAL and FAMILY HISTORY:  From [place].  Single.  Not employed.  3 children: 15, 3 and 7 months.  [name], a friend, has acted as interpreter in the ER. (Some of the Social history came out differently at the hospital)  PHYSICAL EXAM:  BP 124/72  Pulse 76  Temp(Src) 98 F (36.7 C)  Resp 18  Ht 5\' 3"  (1.6 m)  Wt 159 lb (72.122 kg)  BMI 28.17 kg/m2  LMP 10/11/2012  General: WN Hispanic F who is alert.  Abdomen: Soft. No mass. No tenderness.  Incisions look good.  DATA REVIEWED:  Path report to the patient.  Ovidio Kin, MD, [facility]  [address]  [place], [place] [address]  Phone: [phone] FAX: [phone]

## 2013-04-24 IMAGING — US US OB DETAIL+14 WK
2 series · 12 of 28 positions shown · non-contrast
Comparison: none

[Series 1: us ob detail +14 wk · 10 of 79 slices shown (1 of 2)]
[im 4/79]
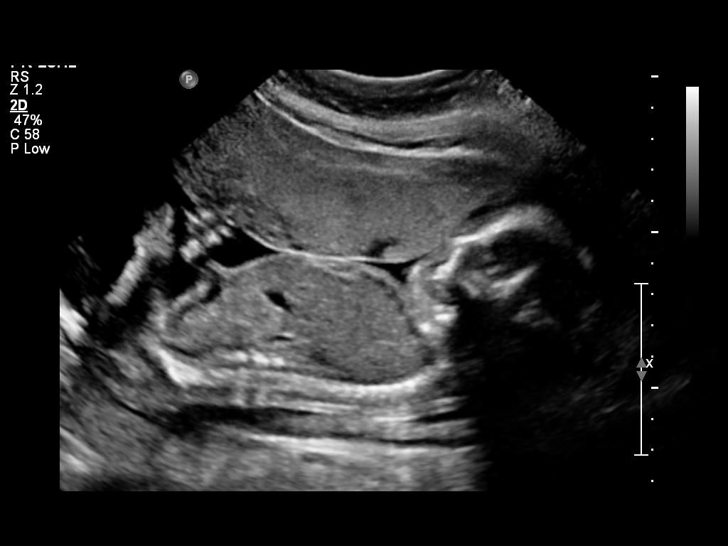
[im 11/79]
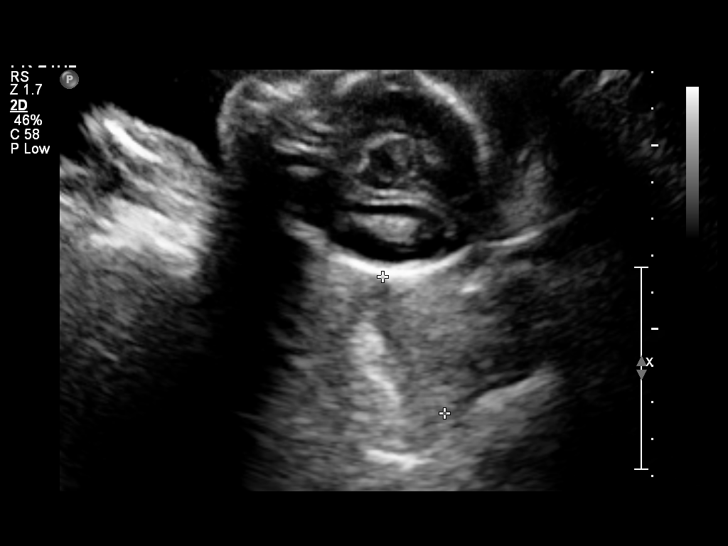
[im 17/79]
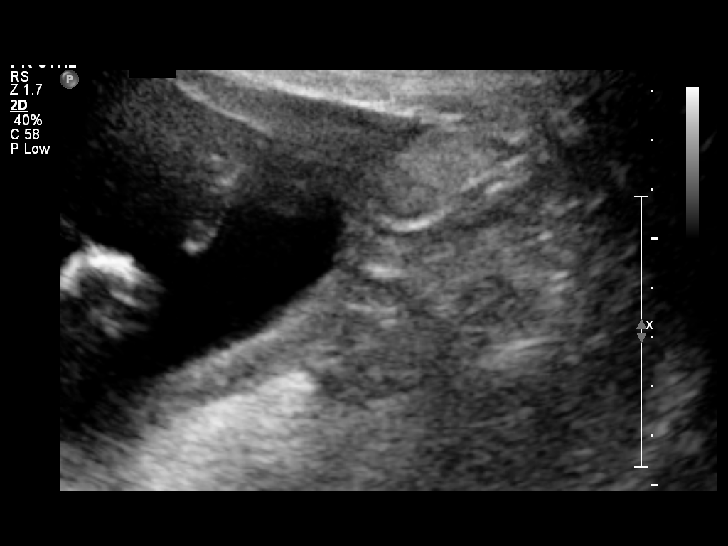
[im 28/79]
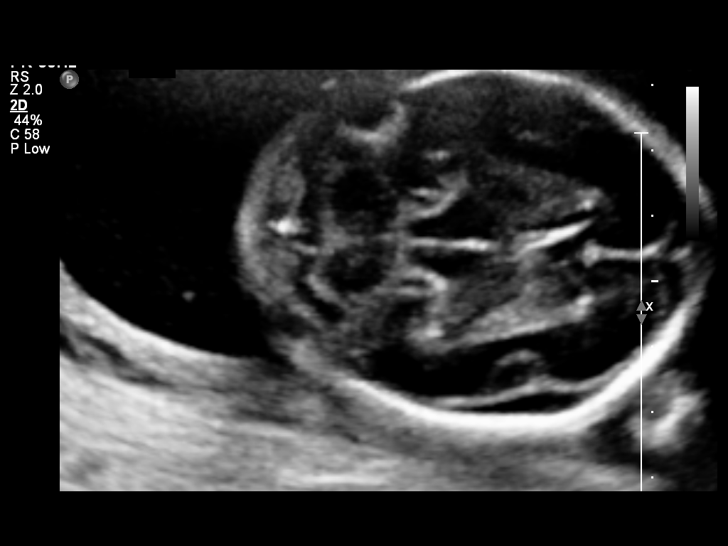
[im 34/79]
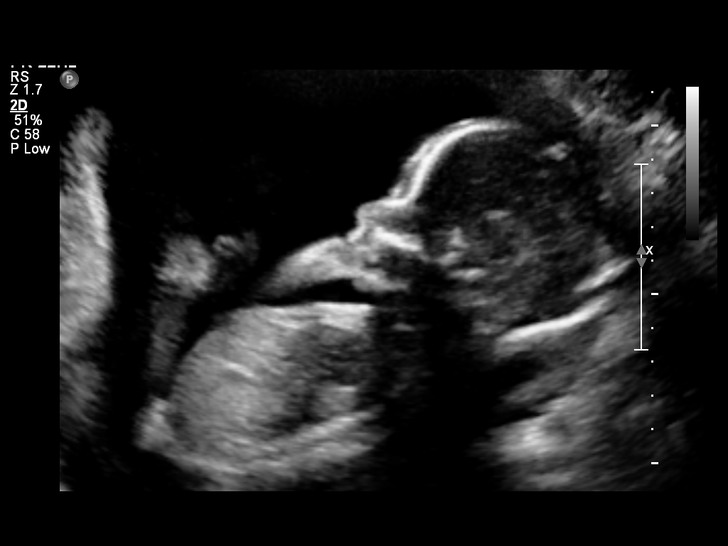
[im 41/79]
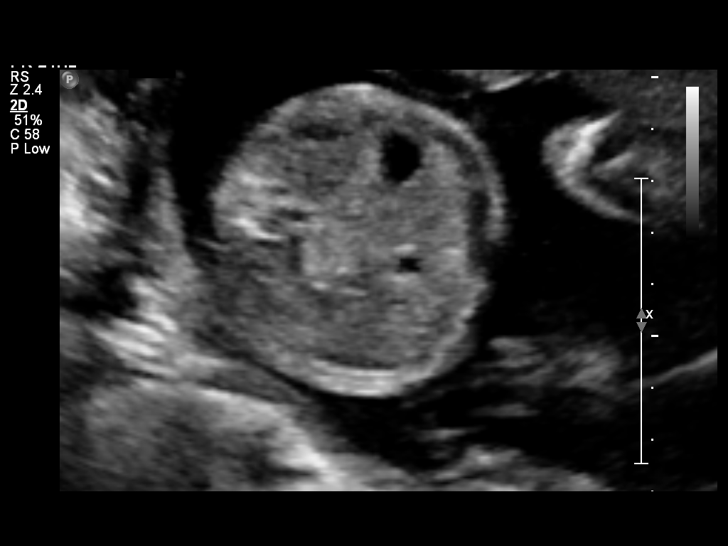
[im 51/79]
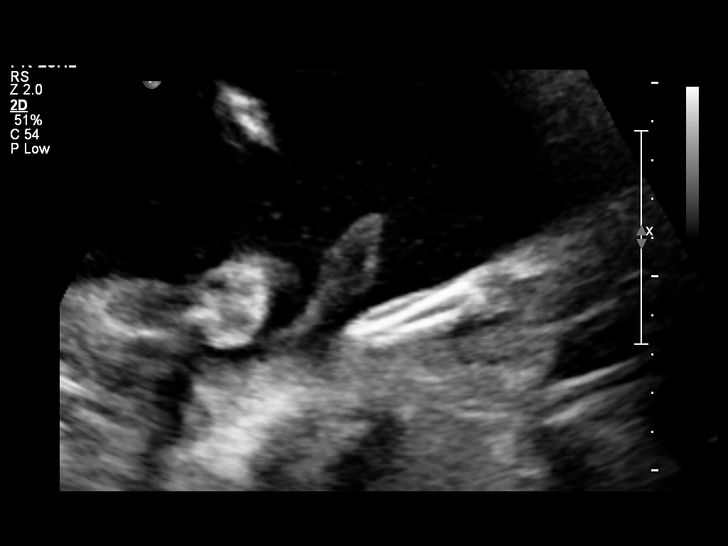
[im 58/79]
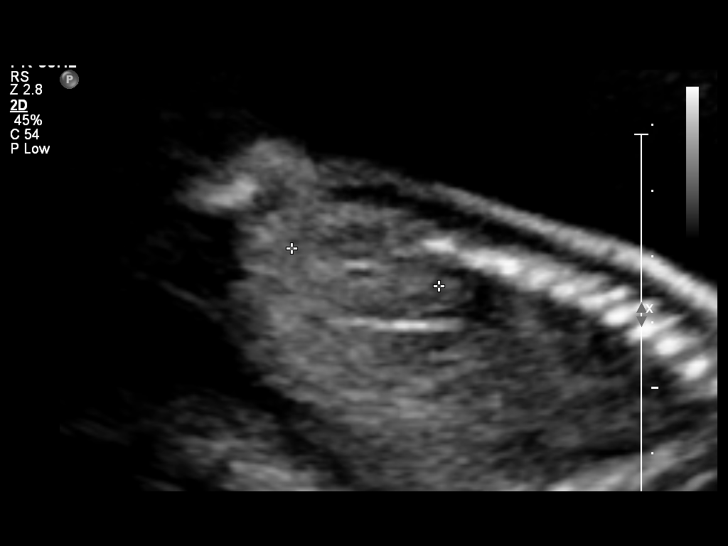
[im 65/79]
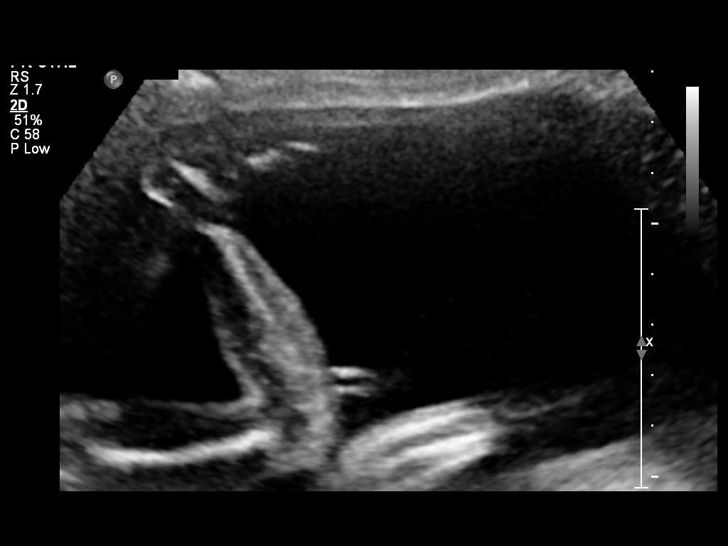
[im 75/79]
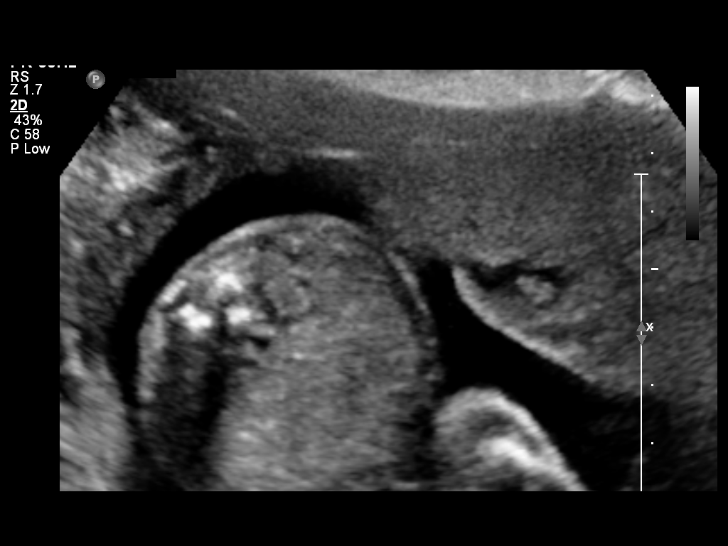

[Series 1: us ob detail +14 wk · 14 acquisitions, 2 frames shown (2 of 2)]
[im 1/14]
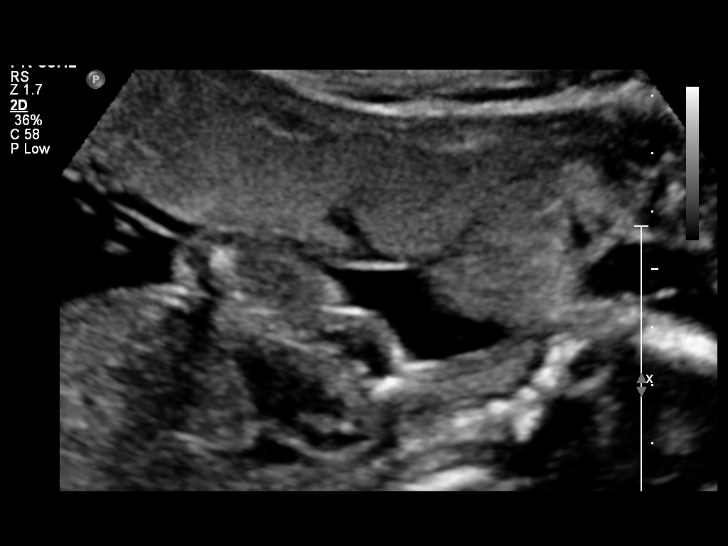
[im 9/14]
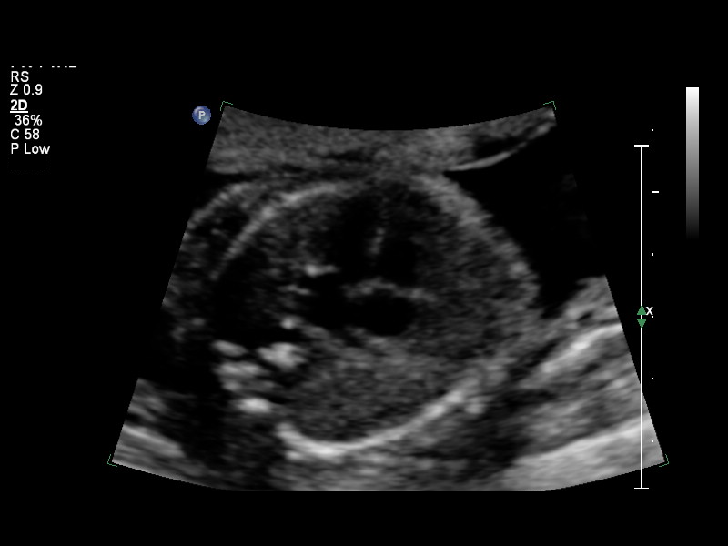

[12 of 28 positions shown; findings below may reference images not displayed]

OBSTETRICS REPORT
                      (Signed Final 09/07/2011 [DATE])

          TOD

 Order#:         [id number]
Procedures

 US OB DETAIL + 14 WK                                  76811.0
Indications

 Detailed fetal anatomic survey
 Poor obstetrical history (prev ectopic pregnancy)
Fetal Evaluation

 Fetal Heart Rate:  156                         bpm
 Cardiac Activity:  Observed
 Presentation:      Variable
 Placenta:          Anterior, above cervical os
 P. Cord            Visualized
 Insertion:
Biometry

 BPD:     53.9  mm    G. Age:   22w 3d                CI:        71.91   70 - 86
                                                      FL/HC:      18.7   18.4 -

 HC:     202.3  mm    G. Age:   22w 2d       73  %    HC/AC:      1.09   1.06 -

 AC:     186.1  mm    G. Age:   23w 3d       91  %    FL/BPD:     70.3   71 - 87
 FL:      37.9  mm    G. Age:   22w 1d       59  %    FL/AC:      20.4   20 - 24
 CER:     22.8  mm    G. Age:   21w 3d       44  %

 Est. FW:     533  gm      1 lb 3 oz     63  %
Gestational Age

 LMP:           21w 4d       Date:   04/09/11                 EDD:   01/14/12
 U/S Today:     22w 4d                                        EDD:   01/07/12
 Best:          21w 4d    Det. By:   LMP  (04/09/11)          EDD:   01/14/12
Genetic Sonogram - Trisomy 21 Screening

 Age:                                             35          Risk=1:   389
 Echogenic bowel:                                 No          LR :
 Hypoplastic/absent Nasal bone:                   No
 Choroid plexus cysts:                            No
 Structural anomalies (inc. cardiac):             No          LR :
 Hypoplastic / absent midphalanx 5th Digit:       No
 Short femur:                                     No          LR :
 2-vessel umbilical cord:                         No
 Pyelectasis:                                     No          LR :
 Echogenic cardiac foci:                          No          LR :

 9 Of 9 Criteria Were Visualized and 0 Abnormal(s) Were Seen.
 Ultrasound Modified Risk for Fetal Down Syndrome = [DATE]
Anatomy

 Cranium:           Appears normal      Aortic Arch:       Appears normal
 Fetal Cavum:       Appears normal      Ductal Arch:       Appears normal
 Ventricles:        Appears normal      Diaphragm:         Appears normal
 Choroid Plexus:    Appears normal      Stomach:           Appears
                                                           normal, left
                                                           sided
 Cerebellum:        Appears normal      Abdomen:           Appears normal
 Posterior Fossa:   Appears normal      Abdominal Wall:    Appears nml
                                                           (cord insert,
                                                           abd wall)
 Nuchal Fold:       Not applicable      Cord Vessels:      Appears normal
                    (>20 wks GA)                           (3 vessel cord)
 Face:              Appears normal      Kidneys:           Appear normal
                    (lips/profile/orbit
                    s)
 Heart:             Appears normal      Bladder:           Appears normal
                    (4 chamber &
                    axis)
 RVOT:              Appears normal      Spine:             Appears normal
 LVOT:              Appears normal      Limbs:             Appears normal
                                                           (hands, ankles,
                                                           feet)

 Other:     Fetus appears to be a female. Heels and 5th digit
            visualized. Nasal bone visualized.
Targeted Anatomy

 Fetal Central Nervous System
 Lat. Ventricles:   6.0                 Cisterna Magna:
Cervix Uterus Adnexa

 Cervical Length:   4.06      cm

 Cervix:       Normal appearance by transabdominal scan.

 Left Ovary:   Not visualized.
 Right Ovary:  Within normal limits.
 Adnexa:     No abnormality visualized.
Impression

 Single living IUP.  US EGA is concordant with LMP.
 No fetal anomalies seen involving visualized anatomy.
 No sonographic markers for aneuploidy visualized.

 LORENZ JUMPER with us.  Please do not hesitate to

## 2013-05-25 ENCOUNTER — Inpatient Hospital Stay (HOSPITAL_COMMUNITY): Payer: Self-pay

## 2013-05-25 ENCOUNTER — Inpatient Hospital Stay (HOSPITAL_COMMUNITY)
Admission: AD | Admit: 2013-05-25 | Discharge: 2013-05-25 | DRG: 778 | Disposition: A | Discharge status: Home / Self Care | Payer: Medicaid Other | Source: Ambulatory Visit | Attending: Obstetrics and Gynecology | Admitting: Obstetrics and Gynecology

## 2013-05-25 ENCOUNTER — Encounter (HOSPITAL_COMMUNITY): Payer: Self-pay | Admitting: Family

## 2013-05-25 DIAGNOSIS — K219 Gastro-esophageal reflux disease without esophagitis: Secondary | ICD-10-CM | POA: Diagnosis present

## 2013-05-25 DIAGNOSIS — O200 Threatened abortion: Secondary | ICD-10-CM

## 2013-05-25 DIAGNOSIS — O0910 Supervision of pregnancy with history of ectopic or molar pregnancy, unspecified trimester: Secondary | ICD-10-CM

## 2013-05-25 DIAGNOSIS — O209 Hemorrhage in early pregnancy, unspecified: Principal | ICD-10-CM | POA: Diagnosis present

## 2013-05-25 DIAGNOSIS — R109 Unspecified abdominal pain: Secondary | ICD-10-CM | POA: Diagnosis present

## 2013-05-25 LAB — WET PREP, GENITAL
Clue Cells Wet Prep HPF POC: NONE SEEN
TRICH WET PREP: NONE SEEN
Yeast Wet Prep HPF POC: NONE SEEN

## 2013-05-25 LAB — CBC
HCT: 37.6 % (ref 36.0–46.0)
Hemoglobin: 12.4 g/dL (ref 12.0–15.0)
MCH: 28.1 pg (ref 26.0–34.0)
MCHC: 33 g/dL (ref 30.0–36.0)
MCV: 85.1 fL (ref 78.0–100.0)
PLATELETS: 446 10*3/uL — AB (ref 150–400)
RBC: 4.42 MIL/uL (ref 3.87–5.11)
RDW: 13.7 % (ref 11.5–15.5)
WBC: 9.2 10*3/uL (ref 4.0–10.5)

## 2013-05-25 LAB — URINALYSIS, ROUTINE W REFLEX MICROSCOPIC
Bilirubin Urine: NEGATIVE
GLUCOSE, UA: NEGATIVE mg/dL
Ketones, ur: NEGATIVE mg/dL
LEUKOCYTES UA: NEGATIVE
Nitrite: NEGATIVE
PH: 7 (ref 5.0–8.0)
Protein, ur: NEGATIVE mg/dL
Specific Gravity, Urine: 1.02 (ref 1.005–1.030)
Urobilinogen, UA: 0.2 mg/dL (ref 0.0–1.0)

## 2013-05-25 LAB — POCT PREGNANCY, URINE: PREG TEST UR: POSITIVE — AB

## 2013-05-25 LAB — ABO/RH: ABO/RH(D): B POS

## 2013-05-25 LAB — URINE MICROSCOPIC-ADD ON

## 2013-05-25 LAB — HCG, QUANTITATIVE, PREGNANCY: HCG, BETA CHAIN, QUANT, S: 8968 m[IU]/mL — AB (ref <5)

## 2013-05-25 NOTE — MAU Provider Note (Signed)
Attestation of Attending Supervision of Advanced Practitioner (CNM/NP): Evaluation and management procedures were performed by the Advanced Practitioner under my supervision and collaboration.  I have reviewed the Advanced Practitioner's note and chart, and I agree with the management and plan.  Nica Friske 05/25/2013 7:19 PM

## 2013-05-25 NOTE — MAU Provider Note (Signed)
History     CSN: [id number]  Arrival date and time: 05/25/13 1139   First Provider Initiated Contact with Patient 05/25/13 1304      Chief Complaint  Patient presents with  . Vaginal Bleeding   HPI This is a 38 y.o. female at [redacted]w[redacted]d by LMP who presents with c/o vaginal bleeding. Has some cramping. Has a history of ectopic pregnancy  RN Note: 38 yo, LMP 03/04/13; reports VB since yesterday. Denies pain. Last intercourse 2 weeks ago. Interpreter utilized.        OB History   Grav Para Term Preterm Abortions TAB SAB Ect Mult Living   5 3 3  0 1 0 0 1 0 3      Past Medical History  Diagnosis Date  . Abnormal Pap smear   . Hx of chlamydia infection   . Fibroid   . Hx: UTI (urinary tract infection)   . Heart murmur     asymptomatic systolic murmur  . Hx of ectopic pregnancy   . Late prenatal care   . Language barrier   . Thrombocytosis   . Headache(784.0)   . GERD (gastroesophageal reflux disease)     Past Surgical History  Procedure Laterality Date  . Ectopic pregnancy surgery  6years ago  . Salpingectomy    . Cholecystectomy N/A 10/19/2012    Procedure: LAPAROSCOPIC CHOLECYSTECTOMY WITH INTRAOPERATIVE CHOLANGIOGRAM;  Surgeon: Shann Medal, MD;  Location: [facility] OR;  Service: General;  Laterality: N/A;    Family History  Problem Relation Age of Onset  . Seizures Son   . Hypertension Son     History  Substance Use Topics  . Smoking status: Never Smoker   . Smokeless tobacco: Not on file  . Alcohol Use: No    Allergies: No Known Allergies  Prescriptions prior to admission  Medication Sig Dispense Refill  . ibuprofen (ADVIL,MOTRIN) 600 MG tablet Take 1 tablet (600 mg total) by mouth every 6 (six) hours as needed for pain.  30 tablet  0    Review of Systems  Constitutional: Negative for fever, chills and malaise/fatigue.  Gastrointestinal: Negative for nausea, vomiting and abdominal pain.  Genitourinary:       Vaginal bleeding  Neurological: Negative  for dizziness and headaches.   Physical Exam   Blood pressure 100/59, pulse 67, temperature 98 F (36.7 C), temperature source Oral, resp. rate 13, last menstrual period 03/04/2013.  Physical Exam  Constitutional: She is oriented to person, place, and time. She appears well-developed and well-nourished. No distress.  HENT:  Head: Normocephalic.  Cardiovascular: Normal rate.   Respiratory: Effort normal.  GI: Soft. She exhibits no distension and no mass. There is tenderness (slightly tender over uterus which is small, 5-6 wk size). There is no rebound and no guarding.  Genitourinary: Uterus normal. Vaginal discharge (small to moderate mucousy blood, cervix long/closed, adnexae nontender) found.  Musculoskeletal: Normal range of motion.  Neurological: She is alert and oriented to person, place, and time.  Skin: Skin is warm and dry.  Psychiatric: She has a normal mood and affect.    MAU Course  Procedures  MDM Results for orders placed during the hospital encounter of 05/25/13 (from the past 24 hour(s))  URINALYSIS, ROUTINE W REFLEX MICROSCOPIC     Status: Abnormal   Collection Time    05/25/13 12:00 PM      Result Value Range   Color, Urine YELLOW  YELLOW   APPearance CLEAR  CLEAR   Specific Gravity, Urine 1.020  1.005 - 1.030   pH 7.0  5.0 - 8.0   Glucose, UA NEGATIVE  NEGATIVE mg/dL   Hgb urine dipstick MODERATE (*) NEGATIVE   Bilirubin Urine NEGATIVE  NEGATIVE   Ketones, ur NEGATIVE  NEGATIVE mg/dL   Protein, ur NEGATIVE  NEGATIVE mg/dL   Urobilinogen, UA 0.2  0.0 - 1.0 mg/dL   Nitrite NEGATIVE  NEGATIVE   Leukocytes, UA NEGATIVE  NEGATIVE  URINE MICROSCOPIC-ADD ON     Status: Abnormal   Collection Time    05/25/13 12:00 PM      Result Value Range   Squamous Epithelial / LPF FEW (*) RARE   RBC / HPF 3-6  <3 RBC/hpf   Urine-Other MUCOUS PRESENT    POCT PREGNANCY, URINE     Status: Abnormal   Collection Time    05/25/13 12:28 PM      Result Value Range   Preg  Test, Ur POSITIVE (*) NEGATIVE  WET PREP, GENITAL     Status: Abnormal   Collection Time    05/25/13  1:05 PM      Result Value Range   Yeast Wet Prep HPF POC NONE SEEN  NONE SEEN   Trich, Wet Prep NONE SEEN  NONE SEEN   Clue Cells Wet Prep HPF POC NONE SEEN  NONE SEEN   WBC, Wet Prep HPF POC MANY (*) NONE SEEN  CBC     Status: Abnormal   Collection Time    05/25/13  1:20 PM      Result Value Range   WBC 9.2  4.0 - 10.5 K/uL   RBC 4.42  3.87 - 5.11 MIL/uL   Hemoglobin 12.4  12.0 - 15.0 g/dL   HCT 37.6  36.0 - 46.0 %   MCV 85.1  78.0 - 100.0 fL   MCH 28.1  26.0 - 34.0 pg   MCHC 33.0  30.0 - 36.0 g/dL   RDW 13.7  11.5 - 15.5 %   Platelets 446 (*) 150 - 400 K/uL  ABO/RH     Status: None   Collection Time    05/25/13  1:20 PM      Result Value Range   ABO/RH(D) B POS    HCG, QUANTITATIVE, PREGNANCY     Status: Abnormal   Collection Time    05/25/13  1:20 PM      Result Value Range   hCG, Beta Chain, Quant, S 8968 (*) <5 mIU/mL   US Ob Transvaginal  05/25/2013   CLINICAL DATA:  Early pregnancy, bleeding ; quantitative beta HCG = 8,968  EXAM: OBSTETRIC <14 WK [place] AND TRANSVAGINAL OB US  TECHNIQUE: Both transabdominal and transvaginal ultrasound examinations were performed for complete evaluation of the gestation as well as the maternal uterus, adnexal regions, and pelvic cul-de-sac. Transvaginal technique was performed to assess early pregnancy.  COMPARISON:  None.  FINDINGS: Intrauterine gestational sac: Present but elongated and slightly irregular  Yolk sac:  Not identified  Embryo: Soft tissue density 3.5 mm diameter, uncertain if represents a fetal pole  Cardiac Activity: Not identified  Heart Rate:  N/A bpm  MSD:  17.8  mm   6 w   5  d  Maternal uterus/adnexae:  No subchronic hemorrhage.  Right ovary normal in size at 3.2 x 2.1 x 1.7 cm.  Left ovary normal in size at 1.9 x 0.7 x 1.6 cm.  No adnexal masses or free pelvic fluid.    IMPRESSION: Gestational sac present within uterus but  sac  is irregular and elongated, abnormal in appearance.  No yolk sac identified.  Small 4 mm soft tissue density within sac, unable to completely exclude fetal pole.  Findings are consistent with an intrauterine pregnancy but viability is uncertain due to the lack of a defined fetal pole and fetal cardiac activity.  Ultrasound recommended in 14 days to assess viability.     Electronically Signed   By: Lavonia Dana M.D.   On: 05/25/2013 14:34    Assessment and Plan  A:  SIUP with gestational sac measuring 6.5wks       Probable failed pregnancy, but cannot determine for certain now  P:  Discussed with Dr Elly Modena      Discussed findings with pt and husband, as to what possible diagnoses are and SAB precautions       Will repeat US next week and have her go to Clinic for results discussion.  Hansel Feinstein 05/25/2013, 1:12 PM

## 2013-05-25 NOTE — MAU Note (Addendum)
38 yo, LMP 03/04/13; reports VB since yesterday. Denies pain. Last intercourse 2 weeks ago. Interpreter utilized.

## 2013-05-25 NOTE — Discharge Instructions (Signed)
Amenaza de aborto  (Threatened Miscarriage)  La hemorragia en las primeras 20 semanas de embarazo es algo frecuente. Se denomina amenaza de aborto Es un problema del embarazo que ocurre antes de la vigésima semana y que sugiere la probabilidad de que ocurra un aborto espontáneo. Generalmente esta hemorragia se detiene con reposo o con disminución de las actividades, según le ha sugerido el profesional que la asiste, y el embarazo continúa sin mayores problemas. Le indicarán que no mantenga relaciones sexuales, no tenga orgasmos ni use tampones hasta que la autoricen. En algunos casos la amenaza de aborto progresará hasta el aborto completo o incompleto. En algunos casos puede ser necesario un tratamiento adicional, en otros casos no. Algunos abortos ocurren antes que la mujer note que no ha menstruado y de que sepa que está embarazada.  Un aborto ocurre en el 15% al 20% de todos los embarazos y generalmente durante las primeras 13 semanas. En la mayoría de los casos se desconoce la causa exacta. Es una manera en que la naturaliza pone fin a un embarazo anormal o que no llegará a término. Algunas de las cosas que ponen en riesgo el embarazo son:  · Los cambios hormonales.  · Infección o tumores en el útero.  · Enfermedades crónicas, por ejemplo la diabetes, especialmente si no se ha controlado.  · Forma anormal del útero.  · Fibromas en el útero  · Cérvix incompetente (es demasiado débil para contener al bebé)  · El consumo de cigarrillos.  · Beber alcohol en exceso. Lo mejor es abstenerse de beber alcohol durante el embarazo.  · El consumo de drogas  TRATAMIENTO  No es necesario realizar un tratamiento adicional cuando el aborto es completo y todos los componentes de la concepción (todos los tejidos) se han eliminado. Si ha eliminado tejidos, consérvelos en un recipiente y llévelos al médico para que los evalúe. Si el aborto es incompleto (partes del feto o de la placenta quedan adentro) será necesario un  tratamiento adicional. La razón más frecuente para realizar un tratamiento es el sangrado (hemorragia) continuo debido a que los tejidos no se han eliminado completamente. Esto sucede cuando el aborto es incompleto. También puede suceder que los tejidos que no se han expulsado se infecten. El tratamiento consiste en la dilatación y curetaje (remoción de los productos del embarazo que pudieran quedar en el útero). Se realizará simplemente por medio de la succión (curetaje por succión) o por un raspado simple del interior del útero. Podrá llevarse a cabo en el hospital o en el consultorio del profesional. Sólo se lleva a cabo cuando el profesional se asegura que no hay posibilidades de que el embarazo llegue a término. Esto se determina por medio del examen físico, la prueba de embarazo negativa, el recuento hormonal y el ultrasonido que confirme la muerte del feto.  El aborto generalmente es una situación emocionalmente difícil para los padres. No es por su culpa ni la de su pareja. No ocurre por conductas inapropiadas por parte suya o de su compañero. Casi todos los abortos se producen porque el embarazo ha comenzado de un modo incorrecto. Al menos la mitad de estos embarazos presenta anormalidades cromosómicas. Casi nunca se trata de un trastorno congénito. En otros puede haber problemas de desarrollo en el feto o en la placenta. Esto no siempre se revela, aún cuando se estudien los productos del aborto con el microscopio. No se sienta culpable y probablemente no podría haber evitado que esto ocurra. Si tiene problemas emocionales debido a   Casi siempre puede tratar de embarazarse nuevamente tan pronto el profesional la autorice. [place]  El Corporate investment banker reposo, segn la importancia de la hemorragia y los dolores que [place]. Probablemente slo la autorice a  levantarse para ir al bao. Tambin podr autorizarla a Armed forces operational officer. En este momento usted Product manager algunos arreglos para que otra persona se ocupe del cuidado de los nios y de otras responsabilidades adicionales.  Lleve un registro de la cantidad y la saturacin de las toallas higinicas que Medical laboratory scientific officer. Anote esta informacin.  NO USE TAMPONES. No se haga duchas vaginales ni tenga relaciones sexuales u orgasmos hasta que el mdico la autorice.  Puede ser que le indiquen una cita para un seguimiento en el que volvern a Chief of Staff el [place] de su Media planner y Water engineer repetirn las pruebas de [place]. Concurra para una nueva evaluacin dentro de 2 das y nuevamente dentro de 4 a 6 semanas. Es muy importante que realice el seguimiento en el momento que le han indicado.  Si su grupo sanguneo es Rh negativo y el del padre es Rh positivo, o si no conoce el grupo sanguneo del padre, le indicarn una inyeccin (inmunoglobulina Rh) para prevenir los anticuerpos anormales que pueden desarrollarse y Print production planner al beb en futuros embarazos. SOLICITE ATENCIN MDICA DE INMEDIATO SI:  Siente calambres intensos en el estmago, en la espalda o en el abdomen.  Siente un dolor intenso de comienzo sbito en la zona inferior del abdomen.  Comienza a sentir escalofros.  La temperatura se eleva por encima de 101 F (38.3 C).  Elimina cogulos o tejidos grandes. Guarde una muestra de esos tejidos para que el profesional lo inspeccione.  La hemorragia aumenta o se siente mareada, dbil o tiene episodios de desmayos.  Tiene una prdida de lquido por la vagina.  Se desmaya. No puede mover el intestino. Podra tratarse de un embarazo ectpico. Document Released: 01/19/2005 Document Revised: 07/04/2011 [facility] Patient Information 2014 [place], [place].  Reposo plvico  (Pelvic Rest) El reposo plvico se recomienda a las mujeres cuando:   La placenta cubre parcial o completamente la  abertura del cuello del tero (placenta previa).  Hay sangrado entre la pared del tero y el saco amnitico en el primer trimestre (hemorragia subcorinica).  El cuello uterino comienza a abrirse sin iniciarse el trabajo de parto (cuello uterino incompetente, insuficiencia cervical).  El [place] de parto se inicia muy pronto (parto prematuro). INSTRUCCIONES PARA EL CUIDADO EN EL HOGAR   No tenga relaciones sexuales, estimulacin, ni orgasmos.  No use tampones, no se haga duchas vaginales ni coloque ningn objeto en la vagina.  No levante objetos que pesen ms de 10 libras (4,5 kg).  Evite las actividades extenuantes o tensionar los msculos de la pelvis. SOLICITE ATENCIN MDICA SI:   Tiene un sangrado vaginal durante el embarazo. Considrelo como una posible emergencia.  Siente clicos en la zona baja del estmago (ms fuertes que los clicos menstruales).  Nota flujo vaginal (acuoso, con moco o [place]).  Siente un dolor en la espalda leve y sordo.  Tiene contracciones regulares o endurecimiento del tero. SOLICITE ATENCIN MDICA DE INMEDIATO SI:  Observa sangrado vaginal y tiene placenta previa.  Document Released: 01/04/2012 [facility] Patient Information 2014 [place], [place].

## 2013-05-26 ENCOUNTER — Encounter (HOSPITAL_COMMUNITY): Payer: Self-pay | Admitting: *Deleted

## 2013-05-26 ENCOUNTER — Inpatient Hospital Stay (HOSPITAL_COMMUNITY)
Admission: AD | Admit: 2013-05-26 | Discharge: 2013-05-26 | Disposition: A | Discharge status: Home / Self Care | Payer: Self-pay | Source: Ambulatory Visit | Attending: Family Medicine | Admitting: Family Medicine

## 2013-05-26 DIAGNOSIS — O200 Threatened abortion: Secondary | ICD-10-CM | POA: Insufficient documentation

## 2013-05-26 DIAGNOSIS — O209 Hemorrhage in early pregnancy, unspecified: Secondary | ICD-10-CM

## 2013-05-26 DIAGNOSIS — J029 Acute pharyngitis, unspecified: Secondary | ICD-10-CM | POA: Insufficient documentation

## 2013-05-26 NOTE — MAU Provider Note (Signed)
Attestation of Attending Supervision of Advanced Practitioner (PA/CNM/NP): Evaluation and management procedures were performed by the Advanced Practitioner under my supervision and collaboration.  I have reviewed the Advanced Practitioner's note and chart, and I agree with the management and plan.  Donnamae Jude, MD [facility] Attending 05/26/2013 3:17 PM

## 2013-05-26 NOTE — MAU Provider Note (Signed)
Chief Complaint  Patient presents with  . Vaginal Bleeding   Interpreter here  Subjective Lauren [name] 38 y.o.  [id number] at [redacted]w[redacted]d by LMP presents with onset 3 days ago of first episode of small amount pink vaginal bleeding. Has no abdominal pain. On pelvic rest since seen here yesterday for EPB. Returns due to increased amount of bleeding about 1 hr ago.  Denies irritative vaginal discharge. No dysuria or hematuria.  Blood type: B pos  Pregnancy course: Seen 05/25/13 MAU  Pertinent Medical History: fibroid Pertinent Ob/Gyn History: hx ectopic x1 Pertinent Surgical History: salpingectomy, cholecystectomy Pertinent Social History: nonsmoker  No prescriptions prior to admission    No Known Allergies   Objective   Filed Vitals:   05/26/13 1335  BP: 123/65  Pulse: 80  Temp: 98.8 F (37.1 C)  Resp: 16     Physical Exam General: WN/WD in NAD  Abdom: soft, NT External genitalia: normal; BUS neg  SSE: small amoun dark red blood, tiny clots, no tissue; cervix with no lesions, appears closed Bimanual: Cervix 1 cm/ long/ LUS bulging; uterus anteverted, NT, 6 weeks size; adnexa nontender, no masses   Lab Results No results found for this or any previous visit (from the past 24 hour(s)).  Ultrasound  US Ob Comp Less 14 Wks  05/25/2013   CLINICAL DATA:  Early pregnancy, bleeding ; quantitative beta HCG = 8,968  EXAM: OBSTETRIC <14 WK [place] AND TRANSVAGINAL OB US  TECHNIQUE: Both transabdominal and transvaginal ultrasound examinations were performed for complete evaluation of the gestation as well as the maternal uterus, adnexal regions, and pelvic cul-de-sac. Transvaginal technique was performed to assess early pregnancy.  COMPARISON:  None.  FINDINGS: Intrauterine gestational sac: Present but elongated and slightly irregular  Yolk sac:  Not identified  Embryo: Soft tissue density 3.5 mm diameter, uncertain if represents a fetal pole  Cardiac Activity: Not identified  Heart  Rate:  N/A bpm  MSD:  17.8  mm   6 w   5  d  Maternal uterus/adnexae:  No subchronic hemorrhage.  Right ovary normal in size at 3.2 x 2.1 x 1.7 cm.  Left ovary normal in size at 1.9 x 0.7 x 1.6 cm.  No adnexal masses or free pelvic fluid.  IMPRESSION: Gestational sac present within uterus but sac is irregular and elongated, abnormal in appearance.  No yolk sac identified.  Small 4 mm soft tissue density within sac, unable to completely exclude fetal pole.  Findings are consistent with an intrauterine pregnancy but viability is uncertain due to the lack of a defined fetal pole and fetal cardiac activity.  Ultrasound recommended in 14 days to assess viability.   Electronically Signed   By: Lavonia Dana M.D.   On: 05/25/2013 14:34   US Ob Transvaginal  05/25/2013   CLINICAL DATA:  Early pregnancy, bleeding ; quantitative beta HCG = 8,968  EXAM: OBSTETRIC <14 WK [place] AND TRANSVAGINAL OB US  TECHNIQUE: Both transabdominal and transvaginal ultrasound examinations were performed for complete evaluation of the gestation as well as the maternal uterus, adnexal regions, and pelvic cul-de-sac. Transvaginal technique was performed to assess early pregnancy.  COMPARISON:  None.  FINDINGS: Intrauterine gestational sac: Present but elongated and slightly irregular  Yolk sac:  Not identified  Embryo: Soft tissue density 3.5 mm diameter, uncertain if represents a fetal pole  Cardiac Activity: Not identified  Heart Rate:  N/A bpm  MSD:  17.8  mm   6 w   5  d  Maternal uterus/adnexae:  No subchronic hemorrhage.  Right ovary normal in size at 3.2 x 2.1 x 1.7 cm.  Left ovary normal in size at 1.9 x 0.7 x 1.6 cm.  No adnexal masses or free pelvic fluid.  IMPRESSION: Gestational sac present within uterus but sac is irregular and elongated, abnormal in appearance.  No yolk sac identified.  Small 4 mm soft tissue density within sac, unable to completely exclude fetal pole.  Findings are consistent with an intrauterine pregnancy but  viability is uncertain due to the lack of a defined fetal pole and fetal cardiac activity.  Ultrasound recommended in 14 days to assess viability.   Electronically Signed   By: Lavonia Dana M.D.   On: 05/25/2013 14:34    Assessment 1. Bleeding in early pregnancy   2. Threatened spontaneous abortion      Plan    Discharge home with ectopic precautions Counseled re: cx open, irregular sac and S<D poor prognostic signs but unable to determine if pregnancy has failed. Advised to bring any tissue passed or return if severe abdominal pain or bleeding more than 1 pad per hour See AVS for pt education   Medication List    Notice   You have not been prescribed any medications.       Keep previously scheduled F/U appointment> [place] and WOC 05/31/13   Lauren [name] 05/26/2013 1:52 PM

## 2013-05-26 NOTE — Discharge Instructions (Signed)
Amenaza de aborto  (Threatened Miscarriage)  La hemorragia en las primeras 20 semanas de embarazo es algo frecuente. Se denomina amenaza de aborto Es un problema del embarazo que ocurre antes de la vigésima semana y que sugiere la probabilidad de que ocurra un aborto espontáneo. Generalmente esta hemorragia se detiene con reposo o con disminución de las actividades, según le ha sugerido el profesional que la asiste, y el embarazo continúa sin mayores problemas. Le indicarán que no mantenga relaciones sexuales, no tenga orgasmos ni use tampones hasta que la autoricen. En algunos casos la amenaza de aborto progresará hasta el aborto completo o incompleto. En algunos casos puede ser necesario un tratamiento adicional, en otros casos no. Algunos abortos ocurren antes que la mujer note que no ha menstruado y de que sepa que está embarazada.  Un aborto ocurre en el 15% al 20% de todos los embarazos y generalmente durante las primeras 13 semanas. En la mayoría de los casos se desconoce la causa exacta. Es una manera en que la naturaliza pone fin a un embarazo anormal o que no llegará a término. Algunas de las cosas que ponen en riesgo el embarazo son:  · Los cambios hormonales.  · Infección o tumores en el útero.  · Enfermedades crónicas, por ejemplo la diabetes, especialmente si no se ha controlado.  · Forma anormal del útero.  · Fibromas en el útero  · Cérvix incompetente (es demasiado débil para contener al bebé)  · El consumo de cigarrillos.  · Beber alcohol en exceso. Lo mejor es abstenerse de beber alcohol durante el embarazo.  · El consumo de drogas  TRATAMIENTO  No es necesario realizar un tratamiento adicional cuando el aborto es completo y todos los componentes de la concepción (todos los tejidos) se han eliminado. Si ha eliminado tejidos, consérvelos en un recipiente y llévelos al médico para que los evalúe. Si el aborto es incompleto (partes del feto o de la placenta quedan adentro) será necesario un  tratamiento adicional. La razón más frecuente para realizar un tratamiento es el sangrado (hemorragia) continuo debido a que los tejidos no se han eliminado completamente. Esto sucede cuando el aborto es incompleto. También puede suceder que los tejidos que no se han expulsado se infecten. El tratamiento consiste en la dilatación y curetaje (remoción de los productos del embarazo que pudieran quedar en el útero). Se realizará simplemente por medio de la succión (curetaje por succión) o por un raspado simple del interior del útero. Podrá llevarse a cabo en el hospital o en el consultorio del profesional. Sólo se lleva a cabo cuando el profesional se asegura que no hay posibilidades de que el embarazo llegue a término. Esto se determina por medio del examen físico, la prueba de embarazo negativa, el recuento hormonal y el ultrasonido que confirme la muerte del feto.  El aborto generalmente es una situación emocionalmente difícil para los padres. No es por su culpa ni la de su pareja. No ocurre por conductas inapropiadas por parte suya o de su compañero. Casi todos los abortos se producen porque el embarazo ha comenzado de un modo incorrecto. Al menos la mitad de estos embarazos presenta anormalidades cromosómicas. Casi nunca se trata de un trastorno congénito. En otros puede haber problemas de desarrollo en el feto o en la placenta. Esto no siempre se revela, aún cuando se estudien los productos del aborto con el microscopio. No se sienta culpable y probablemente no podría haber evitado que esto ocurra. Si tiene problemas emocionales debido a   este problema, convérselo con el profesional y solicite ayuda psicológico antes de un nuevo embarazo. Casi siempre puede tratar de embarazarse nuevamente tan pronto el profesional la autorice.  INSTRUCCIONES PARA EL CUIDADO DOMICILIARIO  · El profesional le indicará reposo, según la importancia de la hemorragia y los dolores que presente. Probablemente sólo la autorice a  levantarse para ir al baño. También podrá autorizarla a realizar una actividad ligera. En este momento usted necesitará hacer algunos arreglos para que otra persona se ocupe del cuidado de los niños y de otras responsabilidades adicionales.  · Lleve un registro de la cantidad y la saturación de las toallas higiénicas que utiliza cada día. Anote esta información.  · NO USE TAMPONES. No se haga duchas vaginales ni tenga relaciones sexuales u orgasmos hasta que el médico la autorice.  · Puede ser que le indiquen una cita para un seguimiento en el que volverán a evaluar el estado de su embarazo y le repetirán las pruebas de sangre. Concurra para una nueva evaluación dentro de 2 días y nuevamente dentro de 4 a 6 semanas. Es muy importante que realice el seguimiento en el momento que le han indicado.  · Si su grupo sanguíneo es Rh negativo y el del padre es Rh positivo, o si no conoce el grupo sanguíneo del padre, le indicarán una inyección (inmunoglobulina Rh) para prevenir los anticuerpos anormales que pueden desarrollarse y afectar al bebé en futuros embarazos.  SOLICITE ATENCIÓN MÉDICA DE INMEDIATO SI:  · Siente calambres intensos en el estómago, en la espalda o en el abdomen.  · Siente un dolor intenso de comienzo súbito en la zona inferior del abdomen.  · Comienza a sentir escalofríos.  · La temperatura se eleva por encima de 101° F (38.3° C).  · Elimina coágulos o tejidos grandes. Guarde una muestra de esos tejidos para que el profesional lo inspeccione.  · La hemorragia aumenta o se siente mareada, débil o tiene episodios de desmayos.  · Tiene una pérdida de líquido por la vagina.  · Se desmaya. No puede mover el intestino. Podría tratarse de un embarazo ectópico.  Document Released: 01/19/2005 Document Revised: 07/04/2011  [employer]® Patient Information ©2014 [employer].

## 2013-05-26 NOTE — MAU Note (Signed)
Patient states she was seen in MAU yesterday. States she is now having more bleeding but no pain. Denies N/V/D. Denies S/S of the flu except has a sore throat for the past 2 days.

## 2013-05-27 ENCOUNTER — Inpatient Hospital Stay (HOSPITAL_COMMUNITY): Payer: Self-pay

## 2013-05-27 ENCOUNTER — Inpatient Hospital Stay (HOSPITAL_COMMUNITY)
Admission: AD | Admit: 2013-05-27 | Discharge: 2013-05-27 | Disposition: A | Discharge status: Home / Self Care | Payer: Self-pay | Source: Ambulatory Visit | Attending: Obstetrics & Gynecology | Admitting: Obstetrics & Gynecology

## 2013-05-27 ENCOUNTER — Encounter (HOSPITAL_COMMUNITY): Payer: Self-pay | Admitting: *Deleted

## 2013-05-27 DIAGNOSIS — O039 Complete or unspecified spontaneous abortion without complication: Secondary | ICD-10-CM

## 2013-05-27 DIAGNOSIS — R109 Unspecified abdominal pain: Secondary | ICD-10-CM | POA: Insufficient documentation

## 2013-05-27 LAB — CBC
HCT: 30 % — ABNORMAL LOW (ref 36.0–46.0)
HEMATOCRIT: 35.5 % — AB (ref 36.0–46.0)
HEMOGLOBIN: 12.2 g/dL (ref 12.0–15.0)
HEMOGLOBIN: 10.1 g/dL — AB (ref 12.0–15.0)
MCH: 29 pg (ref 26.0–34.0)
MCH: 28.5 pg (ref 26.0–34.0)
MCHC: 34.4 g/dL (ref 30.0–36.0)
MCHC: 33.7 g/dL (ref 30.0–36.0)
MCV: 84.5 fL (ref 78.0–100.0)
MCV: 84.5 fL (ref 78.0–100.0)
PLATELETS: 383 10*3/uL (ref 150–400)
Platelets: 426 10*3/uL — ABNORMAL HIGH (ref 150–400)
RBC: 4.2 MIL/uL (ref 3.87–5.11)
RBC: 3.55 MIL/uL — AB (ref 3.87–5.11)
RDW: 13.8 % (ref 11.5–15.5)
RDW: 13.7 % (ref 11.5–15.5)
WBC: 12 10*3/uL — AB (ref 4.0–10.5)
WBC: 12.9 10*3/uL — AB (ref 4.0–10.5)

## 2013-05-27 LAB — GC/CHLAMYDIA PROBE AMP
CT Probe RNA: NEGATIVE
GC Probe RNA: NEGATIVE

## 2013-05-27 LAB — HCG, QUANTITATIVE, PREGNANCY: HCG, BETA CHAIN, QUANT, S: 5367 m[IU]/mL — AB (ref <5)

## 2013-05-27 MED ORDER — MISOPROSTOL 200 MCG PO TABS
800.0000 ug | ORAL_TABLET | ORAL | Status: AC
  Administered 2013-05-27: 800 ug via RECTAL
  Filled 2013-05-27: qty 4

## 2013-05-27 MED ORDER — IBUPROFEN 600 MG PO TABS: 600.0000 mg | ORAL_TABLET | Freq: Four times a day (QID) | ORAL | Status: DC | PRN

## 2013-05-27 MED ORDER — LACTATED RINGERS IV BOLUS (SEPSIS)
1000.0000 mL | Freq: Once | INTRAVENOUS | Status: AC
  Administered 2013-05-27: 1000 mL via INTRAVENOUS

## 2013-05-27 MED ORDER — OXYCODONE-ACETAMINOPHEN 5-325 MG PO TABS: 1.0000 | ORAL_TABLET | ORAL | Status: DC | PRN

## 2013-05-27 NOTE — MAU Note (Signed)
Pt in restroom passing blood clots.

## 2013-05-27 NOTE — MAU Note (Signed)
Patient in triage room. Moved from triage chair to W/C for transport to MAU 3 and passed out in W/C. Revived with ammonia pearl and moved to MAU 3 where she again passed out. Revived once again with ammonia pearl after transferring patient from W/C to stretcher by staff. HOB flat. 02 started via mask at 10 lpm. 02 sat = 99%. BP=106/64. P=74. Fatima Blank, CNM in at [phone]. IV 1 liter of LR started with 20g IVC in right wrist by Mingo Amber, RN at [phone] with labs drawn during venipuncture.

## 2013-05-27 NOTE — Discharge Instructions (Signed)
Aborto incompleto °(Incomplete Miscarriage) °Un aborto espontáneo es la pérdida repentina de un bebé en gestación (feto) antes de la semana 20 del embarazo. En un aborto espontáneo, partes del feto o la placenta (alumbramiento) permanecen en el cuerpo.  °El aborto espontáneo puede ser una experiencia que afecte emocionalmente a la persona. Hable con su médico si tiene preguntas sobre el aborto espontáneo, el proceso de duelo y los planes futuros de embarazo. °CAUSAS  °· Algunos problemas cromosómicos pueden hacer imposible que el bebé se desarrolle normalmente. Los problemas con los genes o cromosomas del bebé son, en la mayoría de los casos, el resultado de errores que se producen, al azar, cuando el embrión se divide y crece. Estos problemas no se heredan de los padres. °· Infección en el cuello del útero. °· Problemas hormonales. °· Problemas en el cuello del útero, como tener un útero incompetente. Esto ocurre cuando los tejidos no son lo suficientemente fuertes como para contener el embarazo. °· Problemas del útero, como un útero con forma anormal, los fibromas o anormalidades congénitas. °· Ciertas enfermedades crónicas. °· No fume, no beba alcohol, ni consuma drogas. °· Traumatismos. °SÍNTOMAS  °· Sangrado o manchado vaginal, con o sin cólicos o dolor. °· Dolor o cólicos en el abdomen o en la cintura. °· Eliminación de líquido, tejidos o coágulos grandes por la vagina. °DIAGNÓSTICO  °El médico le hará un examen físico. También le indicará una ecografía para confirmar el aborto. Es posible que se realicen análisis de sangre. °TRATAMIENTO  °· Generalmente se realiza un procedimiento de dilatación y curetaje (D y C). Durante el procedimiento de dilatación y curetaje, el cuello del útero se abre (dilata) y se retira todo resto de tejido fetal o placentario del útero. °· Si hay una infección, le recetarán antibióticos. Posiblemente le receten otros medicamentos para reducir (contraer) el tamaño del útero si hay  mucha hemorragia. °· Si su tipo de sangre es Rh negativo y el del bebé es Rh positivo, necesitará una inyección de inmunoglobulina Rho(D). Esta inyección protegerá a los futuros bebés de tener problemas de compatibilidad Rh en futuros embarazos. °· Probablemente le indiquen reposo. Esto significa que debe quedarse en cama y levantarse únicamente para ir al baño. °INSTRUCCIONES PARA EL CUIDADO EN EL HOGAR  °· Haga reposo según las indicaciones del médico. °· Limite las actividades según las indicaciones del médico. Es posible que se le permita retomar las actividades livianas si no se le realizó un curetaje, pero necesitará tratamiento adicional. °· Lleve un registro de la cantidad de toallas sanitarias que usa por día. Observe cuán impregnadas (saturadas) están. Registre esta información. °· No  use tampones. °· No se haga duchas vaginales ni tenga relaciones sexuales hasta que el médico la autorice. °· Asista a todas las citas de seguimiento para una nueva evaluación y para continuar el tratamiento. °· Sólo tome medicamentos de venta libre o recetados para calmar el dolor, el malestar o bajar la fiebre, según las indicaciones de su médico. °· Tome los antibióticos como le indicó el médico. Asegúrese de que finaliza la prescripción completa aunque se sienta mejor. °SOLICITE ATENCIÓN MÉDICA DE INMEDIATO SI:  °· Siente calambres intensos en el estómago, en la espalda o en el abdomen. °· Le sube la fiebre sin motivo (asegúrese de registrar las cifras). °· Elimina coágulos grandes o tejidos (consérvelos para que el médico los analice). °· La hemorragia aumenta. °· Se siente mareada, débil o tiene episodios de desmayo. °ASEGÚRESE DE QUE:  °· Comprende estas instrucciones. °·   Controlar su afeccin.  Recibir ayuda de inmediato si no mejora o si empeora. Document Released: 04/11/2005 Document Revised: 01/30/2013 [facility] Patient Information 2014 [place], [place].

## 2013-05-27 NOTE — MAU Provider Note (Signed)
Chief Complaint: Vaginal Bleeding   None    SUBJECTIVE HPI: Lauren [name] is a 38 y.o. [id number] at [redacted]w[redacted]d by LMP who presents to maternity admissions reporting bright red vaginal bleeding with clots.  She was seen 05/25/13 in MAU and had quant hcg of [id number] with abnormal gestational sac with no yolk sac.  Today she presents with an increase in bleeding.  She felt faint and was assisted to the bed and ammonia waved under her nose while in MAU.  She reports mild abdominal cramping and denies vaginal itching/burning, urinary symptoms, h/a, n/v, or fever/chills.     Past Medical History  Diagnosis Date  . Abnormal Pap smear   . Hx of chlamydia infection   . Fibroid   . Hx: UTI (urinary tract infection)   . Heart murmur     asymptomatic systolic murmur  . Hx of ectopic pregnancy   . Late prenatal care   . Language barrier   . Thrombocytosis   . Headache(784.0)   . GERD (gastroesophageal reflux disease)    Past Surgical History  Procedure Laterality Date  . Ectopic pregnancy surgery  6years ago  . Salpingectomy    . Cholecystectomy N/A 10/19/2012    Procedure: LAPAROSCOPIC CHOLECYSTECTOMY WITH INTRAOPERATIVE CHOLANGIOGRAM;  Surgeon: Shann Medal, MD;  Location: [facility];  Service: General;  Laterality: N/A;   History   Social History  . Marital Status: Significant Other    Spouse Name: N/A    Number of Children: N/A  . Years of Education: N/A   Occupational History  . Not on file.   Social History Main Topics  . Smoking status: Never Smoker   . Smokeless tobacco: Not on file  . Alcohol Use: No  . Drug Use: No  . Sexual Activity: Yes    Birth Control/ Protection: None   Other Topics Concern  . Not on file   Social History Narrative  . No narrative on file   No current facility-administered medications on file prior to encounter.   No current outpatient prescriptions on file prior to encounter.   No Known Allergies  ROS: Pertinent items in  HPI  OBJECTIVE Blood pressure 101/59, pulse 79, temperature 98.4 F (36.9 C), temperature source Oral, resp. rate 20, last menstrual period 03/04/2013, SpO2 99.00%. GENERAL: Well-developed, well-nourished female in no acute distress.  HEENT: Normocephalic HEART: normal rate RESP: normal effort ABDOMEN: Soft, non-tender EXTREMITIES: Nontender, no edema NEURO: Alert and oriented Pelvic exam: Cervix pink, visually closed, without lesion, large amount dark red bleeding with large golf ball sized clots x3-4 removed with cotton swabs during exam, vaginal walls and external genitalia normal Bimanual exam: Cervix FT/long/high, firm, anterior, neg CMT, uterus mildly tender, slightly enlarged, adnexa without tenderness, enlargement, or mass  LAB RESULTS Results for orders placed during the hospital encounter of 05/27/13 (from the past 24 hour(s))  CBC     Status: Abnormal   Collection Time    05/27/13  5:06 PM      Result Value Range   WBC 12.0 (*) 4.0 - 10.5 K/uL   RBC 4.20  3.87 - 5.11 MIL/uL   Hemoglobin 12.2  12.0 - 15.0 g/dL   HCT 35.5 (*) 36.0 - 46.0 %   MCV 84.5  78.0 - 100.0 fL   MCH 29.0  26.0 - 34.0 pg   MCHC 34.4  30.0 - 36.0 g/dL   RDW 13.8  11.5 - 15.5 %   Platelets 426 (*) 150 - 400 K/uL  HCG,  QUANTITATIVE, PREGNANCY     Status: Abnormal   Collection Time    05/27/13  5:06 PM      Result Value Range   hCG, Beta Chain, Quant, S 5367 (*) <5 mIU/mL  CBC     Status: Abnormal   Collection Time    05/27/13  8:17 PM      Result Value Range   WBC 12.9 (*) 4.0 - 10.5 K/uL   RBC 3.55 (*) 3.87 - 5.11 MIL/uL   Hemoglobin 10.1 (*) 12.0 - 15.0 g/dL   HCT 30.0 (*) 36.0 - 46.0 %   MCV 84.5  78.0 - 100.0 fL   MCH 28.5  26.0 - 34.0 pg   MCHC 33.7  30.0 - 36.0 g/dL   RDW 13.7  11.5 - 15.5 %   Platelets 383  150 - 400 K/uL    IMAGING   US Ob Transvaginal  05/27/2013   CLINICAL DATA:  Vaginal bleeding.  EXAM: TRANSVAGINAL OB ULTRASOUND  TECHNIQUE: Transvaginal ultrasound was  performed for complete evaluation of the gestation as well as the maternal uterus, adnexal regions, and pelvic cul-de-sac.  COMPARISON:  OB ultrasound 05/25/2013.  FINDINGS: Intrauterine gestational sac: Not visualized. There is heterogeneous, echogenic material within the endometrial cavity likely representing blood products.  Maternal uterus/adnexae: The left ovary is unremarkable. The right ovary is not visualized. No free pelvic fluid is seen.  IMPRESSION: Gestational sac within the uterus seen on the comparison examination is no longer visualized. Heterogeneous material within the endometrial cavity likely represents hemorrhage.   Electronically Signed   By: Inge Rise M.D.   On: 05/27/2013 19:00   US Ob Transvaginal  05/25/2013   CLINICAL DATA:  Early pregnancy, bleeding ; quantitative beta HCG = 8,968  EXAM: OBSTETRIC <14 WK [place] AND TRANSVAGINAL OB US  TECHNIQUE: Both transabdominal and transvaginal ultrasound examinations were performed for complete evaluation of the gestation as well as the maternal uterus, adnexal regions, and pelvic cul-de-sac. Transvaginal technique was performed to assess early pregnancy.  COMPARISON:  None.  FINDINGS: Intrauterine gestational sac: Present but elongated and slightly irregular  Yolk sac:  Not identified  Embryo: Soft tissue density 3.5 mm diameter, uncertain if represents a fetal pole  Cardiac Activity: Not identified  Heart Rate:  N/A bpm  MSD:  17.8  mm   6 w   5  d  Maternal uterus/adnexae:  No subchronic hemorrhage.  Right ovary normal in size at 3.2 x 2.1 x 1.7 cm.  Left ovary normal in size at 1.9 x 0.7 x 1.6 cm.  No adnexal masses or free pelvic fluid.  IMPRESSION: Gestational sac present within uterus but sac is irregular and elongated, abnormal in appearance.  No yolk sac identified.  Small 4 mm soft tissue density within sac, unable to completely exclude fetal pole.  Findings are consistent with an intrauterine pregnancy but viability is uncertain due  to the lack of a defined fetal pole and fetal cardiac activity.  Ultrasound recommended in 14 days to assess viability.   Electronically Signed   By: Lavonia Dana M.D.   On: 05/25/2013 14:34    ASSESSMENT 1. SAB (spontaneous abortion)     PLAN Consult Dr Ihor Dow Cytotec 800 mcg rectally in MAU Repeat CBC.  If stable, Discharge home with bleeding precautions Return to clinic in 1 week for quant hcg Return to MAU sooner as needed  Report to Rozelle Logan, PA, with CBC pending    Medication List  ibuprofen 600 MG tablet  Commonly known as:  ADVIL,MOTRIN  Take 1 tablet (600 mg total) by mouth every 6 (six) hours as needed.     oxyCODONE-acetaminophen 5-325 MG per tablet  Commonly known as:  PERCOCET/ROXICET  Take 1-2 tablets by mouth every 4 (four) hours as needed for severe pain.       Follow-up Information   Follow up with [facility] In 2 weeks. (1:00 pm )    Specialty:  Obstetrics and Gynecology   Contact information:   [place] [address] [phone]      Follow up with [facility]. (As needed if symptoms worsen)    Contact information:   [address] [id number] [place] [address] [phone]      Fatima Blank Certified Nurse-Midwife 05/27/2013  9:20 PM  MDM Assumed care from Fatima Blank, CNM CBC rechecked Patient is hemodynamically stable Discussed results with Dr. Ihor Dow. Patient is stable for discharge. Follow-up in [facility] clinic  A: SAB  P: Discharge home Rx for Percocet and Ibuprofen given to patient  Bleeding precautions discussed Patient will follow-up in [facility] clinic at 1:00 pm on 06/10/13 Patient advised to return to MAU as needed or if her symptoms change or worsen  Farris Has, PA-C  05/27/2013 9:20 PM

## 2013-05-27 NOTE — MAU Provider Note (Signed)
Attestation of Attending Supervision of Advanced Practitioner (CNM/NP): Evaluation and management procedures were performed by the Advanced Practitioner under my supervision and collaboration.  I have reviewed the Advanced Practitioner's note and chart, and I agree with the management and plan.  HARRAWAY-SMITH, Ariannah Arenson 9:41 PM     

## 2013-05-31 ENCOUNTER — Ambulatory Visit (HOSPITAL_COMMUNITY): Payer: Medicaid Other

## 2013-05-31 ENCOUNTER — Telehealth: Payer: Self-pay | Admitting: Obstetrics & Gynecology

## 2013-05-31 ENCOUNTER — Other Ambulatory Visit: Payer: Self-pay

## 2013-05-31 NOTE — Telephone Encounter (Signed)
Called patient with Interpre[employer] and spoke with a child that stated she was in the shower. Informed child we would call her back.

## 2013-05-31 NOTE — Telephone Encounter (Signed)
Patient was told to go to MAU for important blood draw

## 2013-06-01 ENCOUNTER — Encounter (HOSPITAL_COMMUNITY): Payer: Self-pay

## 2013-06-01 ENCOUNTER — Inpatient Hospital Stay (HOSPITAL_COMMUNITY)
Admission: AD | Admit: 2013-06-01 | Discharge: 2013-06-01 | Disposition: A | Discharge status: Home / Self Care | Payer: Self-pay | Source: Ambulatory Visit | Attending: Obstetrics & Gynecology | Admitting: Obstetrics & Gynecology

## 2013-06-01 DIAGNOSIS — O039 Complete or unspecified spontaneous abortion without complication: Secondary | ICD-10-CM | POA: Insufficient documentation

## 2013-06-01 DIAGNOSIS — K219 Gastro-esophageal reflux disease without esophagitis: Secondary | ICD-10-CM | POA: Insufficient documentation

## 2013-06-01 LAB — CBC
HEMATOCRIT: 28.1 % — AB (ref 36.0–46.0)
Hemoglobin: 9.2 g/dL — ABNORMAL LOW (ref 12.0–15.0)
MCH: 28 pg (ref 26.0–34.0)
MCHC: 32.7 g/dL (ref 30.0–36.0)
MCV: 85.7 fL (ref 78.0–100.0)
PLATELETS: 433 10*3/uL — AB (ref 150–400)
RBC: 3.28 MIL/uL — AB (ref 3.87–5.11)
RDW: 13.7 % (ref 11.5–15.5)
WBC: 5.8 10*3/uL (ref 4.0–10.5)

## 2013-06-01 LAB — HCG, QUANTITATIVE, PREGNANCY: hCG, Beta Chain, Quant, S: 323 m[IU]/mL — ABNORMAL HIGH (ref <5)

## 2013-06-01 NOTE — MAU Note (Signed)
Pt states here for f/u bloodwork. Had miscarriage Monday, still bleeding and changing pad q2-3 hours.

## 2013-06-01 NOTE — Discharge Instructions (Signed)
Aborto espontneo  (Miscarriage)  El aborto espontneo es la prdida de un beb que no ha nacido.(feto) antes de la semana 20 del embarazo. La causa generalmente es desconocida.  CUIDADOS EN EL HOGAR   Debe permanecer en cama (reposo en cama) o podr hacer actividades livianas. Regrese a sus actividades segn las indicaciones del mdico.  Pida ayuda con las tareas domsticas.  Anote cuntos apsitos [place] por da. Describa el grado en que estn empapados.  No use tampones. No se higienice la vagina (duchas vaginales) ni tenga relaciones sexuales (coito) hasta que el mdico la autorice.  Slo debe tomar la medicacin segn las indicaciones del mdico.  No tome aspirina.  Cumpla con los controles mdicos segn las indicaciones.  Si usted o su pareja tienen problemas con el duelo, hable con su mdico. Tambin puede intentar con psicoterapia. Permtase el tiempo suficiente de duelo antes de quedar embarazada nuevamente. SOLICITE AYUDA DE INMEDIATO SI:   Siente clicos intensos o dolor en el estmago, en la espalda o en el vientre (abdomen).  Tiene fiebre.  Elimina grumos de sangre (cogulos) por la vagina, que tienen el tamao de una nuez o ms. Guarde los cogulos para que el [employer] vea.  Elimina gran cantidad de tejidos por la vagina. Guarde lo que ha eliminado para que su mdico lo examine.  Aumenta el sangrado.  Observa una secrecin espesa, con mal olor (prdida) que proviene de la vagina.  Se siente mareada, dbil o se desvanece (se desmaya).  Siente escalofros. ASEGRESE DE QUE:   Comprende estas instrucciones.  Controlar su enfermedad.  Solicitar ayuda de inmediato si no mejora o si empeora. Document Released: 10/11/2011 [facility] Patient Information 2014 [place].  Aborto espontneo  (Miscarriage) El aborto espontneo es la prdida de un beb que no ha nacido (feto) antes de la semana 20 del Media planner. La mayor parte de estos abortos ocurre en los  primeros 3 meses. En algunos casos ocurre antes de que la mujer sepa que est [place]. Tambin se denomina "aborto espontneo" o "prdida prematura del embarazo". El aborto espontneo puede ser Ardelia Mems experiencia que afecte emocionalmente a Geologist, engineering. Converse con su mdico si tiene dudas, cmo es el proceso de [place], y sobre planes futuros de Media planner.  CAUSAS   Algunos problemas cromosmicos pueden hacer imposible que el beb se desarrolle normalmente. Los problemas con los genes o cromosomas del beb son generalmente el resultado de errores que se producen, por casualidad, cuando el embrin se divide y crece. Estos problemas no se heredan de los [place].  Infeccin en el cuello del tero.   Problemas hormonales.   Problemas en el cuello del tero, como tener un tero incompetente. Esto ocurre cuando los tejidos no son lo suficientemente fuertes como para Risk manager.   Problemas del tero, como un tero con forma anormal, los fibromas o anormalidades congnitas.   Ciertas enfermedades crnicas.   No fume, no beba alcohol, ni consuma drogas.   Traumatismos  A veces, la causa es desconocida.  SNTOMAS   Sangrado o manchado vaginal, con o sin clicos o dolor.  Dolor o clicos en el abdomen o en la cintura.  Eliminacin de lquido, tejidos o cogulos grandes por la vagina. DIAGNSTICO  El [employer] har un examen fsico. Tambin le indicar una ecografa para confirmar el aborto. Es posible que se realicen anlisis de [place].  TRATAMIENTO   En algunos casos el tratamiento no es necesario, si se eliminan naturalmente todos los tejidos embrionarios que  se encontraban en el tero. Si el feto o la placenta quedan dentro del tero (aborto incompleto), pueden infectarse, los tejidos que quedan pueden infectarse y deben retirarse. Generalmente se realiza un procedimiento de dilatacin y curetaje (D y C). Durante el procedimiento de dilatacin y curetaje, el cuello del tero se  abre (dilata) y se retira cualquier resto de tejido fetal o placentario del tero.  Si hay una infeccin, le recetarn antibiticos. Podrn recetarle otros medicamentos para reducir el tamao del tero (contraerlo) si hay una mucho sangrado.  Si su sangre es Rh negativa y su beb es Rh positivo, usted necesitar la inyeccin de inmunoglobulina Rh. Esta inyeccin proteger a los futuros bebs de tener problemas de compatibilidad Rh en futuros embarazos. INSTRUCCIONES PARA EL CUIDADO EN EL HOGAR   El mdico le indicar reposo en cama o le permitir Automotive engineer. Vuelva a la actividad lentamente o segn las indicaciones de su mdico.  Pdale a alguien que la ayude con las responsabilidades familiares y del hogar durante este tiempo.   Lleve un registro de la cantidad y la saturacin de las toallas higinicas que Medical laboratory scientific officer. Anote esta informacin   No use tampones. No No se haga duchas vaginales ni tenga relaciones sexuales hasta que el mdico la autorice.   Slo tome medicamentos de venta libre o recetados para Glass blower/designer o Health and safety inspector, segn las indicaciones de su mdico.   No tome aspirina. La aspirina puede ocasionar hemorragias.   Concurra puntualmente a las citas de control con el mdico.   Si usted o su pareja tienen dificultades con el duelo, hable con su mdico para buscar la [employer] ayude a Academic librarian la prdida del Media planner. Permtase el tiempo suficiente de duelo antes de quedar embarazada nuevamente.  SOLICITE ATENCIN MDICA DE INMEDIATO SI:   Siente calambres intensos o dolor en la espalda o en el abdomen.  Tiene fiebre.  Elimina grandes cogulos de [place] (del tamao de una nuez o ms) o tejidos por la vagina. Guarde lo que ha eliminado para que su mdico lo examine.   La hemorragia aumenta.   Marget[place] Fast secrecin vaginal espesa y con mal olor.  Se siente mareada, dbil, o se desmaya.   Siente escalofros.   ASEGRESE DE QUE:   Comprende estas instrucciones.  Controlar su enfermedad.  Solicitar ayuda de inmediato si no mejora o si empeora. Document Released: 01/19/2005 Document Revised: 08/06/2012 [facility] Patient Information 2014 [place], [place].

## 2013-06-01 NOTE — MAU Provider Note (Signed)
  History     CSN: [id number]  Arrival date and time: 06/01/13 1040   None     Chief Complaint  Patient presents with  . Follow-Up BHCG    HPI Pt here for f/u HCG- pt states she is feeling much better with less cramping- still bleeding, changing  Joselyn Glassman, RN Registered Nurse Signed  MAU Note Service date: 06/01/2013 11:39 AM   Pt states here for f/u bloodwork. Had miscarriage Monday, still bleeding and changing pad q2-3 hours     Past Medical History  Diagnosis Date  . Abnormal Pap smear   . Hx of chlamydia infection   . Fibroid   . Hx: UTI (urinary tract infection)   . Heart murmur     asymptomatic systolic murmur  . Hx of ectopic pregnancy   . Late prenatal care   . Language barrier   . Thrombocytosis   . Headache(784.0)   . GERD (gastroesophageal reflux disease)     Past Surgical History  Procedure Laterality Date  . Ectopic pregnancy surgery  6years ago  . Salpingectomy    . Cholecystectomy N/A 10/19/2012    Procedure: LAPAROSCOPIC CHOLECYSTECTOMY WITH INTRAOPERATIVE CHOLANGIOGRAM;  Surgeon: Shann Medal, MD;  Location: [facility] OR;  Service: General;  Laterality: N/A;    Family History  Problem Relation Age of Onset  . Seizures Son   . Hypertension Son     History  Substance Use Topics  . Smoking status: Never Smoker   . Smokeless tobacco: Not on file  . Alcohol Use: No    Allergies: No Known Allergies  Prescriptions prior to admission  Medication Sig Dispense Refill  . ibuprofen (ADVIL,MOTRIN) 600 MG tablet Take 1 tablet (600 mg total) by mouth every 6 (six) hours as needed.  30 tablet  0  . oxyCODONE-acetaminophen (PERCOCET/ROXICET) 5-325 MG per tablet Take 1-2 tablets by mouth every 4 (four) hours as needed for severe pain.  20 tablet  0    Review of Systems  Constitutional: Negative for fever and chills.  Gastrointestinal: Negative for nausea, vomiting and abdominal pain.  Genitourinary: Negative for dysuria and urgency.   Physical Exam     Blood pressure 119/76, pulse 74, temperature 98.4 F (36.9 C), temperature source Oral, resp. rate 18, last menstrual period 03/04/2013, SpO2 100.00%, unknown if currently breastfeeding.  Physical Exam  Vitals reviewed. Constitutional: She appears well-developed and well-nourished.  HENT:  Head: Normocephalic.  Eyes: Pupils are equal, round, and reactive to light.  Neck: Normal range of motion. Neck supple.  Cardiovascular: Normal rate.   Respiratory: Effort normal.  GI: Soft.  Musculoskeletal: Normal range of motion.  Neurological: She is alert.  Skin: Skin is warm.    MAU Course  Procedures  11:01 AM 5d ago 1wk ago hCG, Beta Chain, Quant, S <5 mIU/mL  323 (H)    5367 (H) CM   8968 (H) CM     Assessment and Plan  SAB Repeat HCG 06/06/2013 in clinic [facility] 06/01/2013, 12:46 PM

## 2013-06-06 ENCOUNTER — Other Ambulatory Visit: Payer: Self-pay

## 2013-06-06 DIAGNOSIS — O039 Complete or unspecified spontaneous abortion without complication: Secondary | ICD-10-CM

## 2013-06-06 LAB — HCG, QUANTITATIVE, PREGNANCY: hCG, Beta Chain, Quant, S: 48.6 m[IU]/mL

## 2013-06-07 ENCOUNTER — Other Ambulatory Visit: Payer: Self-pay

## 2013-06-10 ENCOUNTER — Ambulatory Visit (INDEPENDENT_AMBULATORY_CARE_PROVIDER_SITE_OTHER): Payer: Self-pay | Admitting: Family Medicine

## 2013-06-10 ENCOUNTER — Encounter: Payer: Self-pay | Admitting: Family Medicine

## 2013-06-10 VITALS — BP 118/74 | HR 67 | Temp 97.5°F | Wt 157.8 lb

## 2013-06-10 DIAGNOSIS — O039 Complete or unspecified spontaneous abortion without complication: Secondary | ICD-10-CM

## 2013-06-10 MED ORDER — PRENATAL VITAMINS 0.8 MG PO TABS: 1.0000 | ORAL_TABLET | Freq: Every day | ORAL | Status: DC

## 2013-06-10 NOTE — Progress Notes (Addendum)
Subjective:     Patient ID: Lauren [name], female   DOB: March 08, 1976, 38 y.o.   MRN: [id number]  HPI Lauren [name] is a 38 y.o. female that presents to clinic for follow-up s/p SAB on 05/27/2013 and given cytotec 661mcg POx1 She had heavy bleeding that has since tapered off. . She reports that she is doing well except for HA with some light sensitivity that is well controlled with Ibuprofen 600mg  Q4h. She is still experiencing vaginal bleeding that has decreased since the SAB. She is using about 2-3 mini pads per day. She describes the blood as sometimes bright red and sometimes brown. She denies fever, chills, dizziness, CP, SOB, abdominal pain, or syncope.    Review of Systems General: no fevers or chills HENT: +HA, no N/V, changes in vision, or changes in hearing Cardiac: no CP  Respiratory: no SOB, hemoptysis, wheezing, or coughs GI: no abdominal pain, diarrhea, constipation, or hematochezia GU: no dysuria or hematuria Psychiatric: no depression      Objective:   Physical Exam   General: She is well-developed and well-nourished. No distress.  HENT:  Head: Normocephalic and atraumatic.  Eyes: Conjunctivae normal and EOM are normal.  Cardiovascular: Normal rate and regular rhythm.  Respiratory: Effort normal. No respiratory distress.  GI: Soft. There is no tenderness. There is no rebound and no guarding.  Musculoskeletal: She exhibits no edema and no tenderness.  Neurological: She is alert and oriented to person, place, and time.  Skin: Skin is warm and dry.  Psychiatric: She has a normal mood and affect.   Recent Results (from the past 2160 hour(s))  URINALYSIS, ROUTINE W REFLEX MICROSCOPIC     Status: Abnormal   Collection Time    05/25/13 12:00 PM      Result Value Ref Range   Color, Urine YELLOW  YELLOW   APPearance CLEAR  CLEAR   Specific Gravity, Urine 1.020  1.005 - 1.030   pH 7.0  5.0 - 8.0   Glucose, UA NEGATIVE  NEGATIVE mg/dL   Hgb urine  dipstick MODERATE (*) NEGATIVE   Bilirubin Urine NEGATIVE  NEGATIVE   Ketones, ur NEGATIVE  NEGATIVE mg/dL   Protein, ur NEGATIVE  NEGATIVE mg/dL   Urobilinogen, UA 0.2  0.0 - 1.0 mg/dL   Nitrite NEGATIVE  NEGATIVE   Leukocytes, UA NEGATIVE  NEGATIVE  URINE MICROSCOPIC-ADD ON     Status: Abnormal   Collection Time    05/25/13 12:00 PM      Result Value Ref Range   Squamous Epithelial / LPF FEW (*) RARE   RBC / HPF 3-6  <3 RBC/hpf   Urine-Other MUCOUS PRESENT    POCT PREGNANCY, URINE     Status: Abnormal   Collection Time    05/25/13 12:28 PM      Result Value Ref Range   Preg Test, Ur POSITIVE (*) NEGATIVE   Comment:            THE SENSITIVITY OF THIS     METHODOLOGY IS >24 mIU/mL  GC/CHLAMYDIA PROBE AMP     Status: None   Collection Time    05/25/13  1:05 PM      Result Value Ref Range   CT Probe RNA NEGATIVE  NEGATIVE   GC Probe RNA NEGATIVE  NEGATIVE   Comment: (NOTE)                                                                                               **  Normal Reference Range: Negative**          Assay performed using the Gen-Probe APTIMA COMBO2 (R) Assay.     Acceptable specimen types for this assay include APTIMA Swabs (Unisex,     endocervical, urethral, or vaginal), first void urine, and ThinPrep     liquid based cytology samples.     Performed at [facility], [facility]     Status: Abnormal   Collection Time    05/25/13  1:05 PM      Result Value Ref Range   Yeast Wet Prep HPF POC NONE SEEN  NONE SEEN   Trich, Wet Prep NONE SEEN  NONE SEEN   Clue Cells Wet Prep HPF POC NONE SEEN  NONE SEEN   WBC, Wet Prep HPF POC MANY (*) NONE SEEN   Comment: MANY BACTERIA SEEN  CBC     Status: Abnormal   Collection Time    05/25/13  1:20 PM      Result Value Ref Range   WBC 9.2  4.0 - 10.5 K/uL   RBC 4.42  3.87 - 5.11 MIL/uL   Hemoglobin 12.4  12.0 - 15.0 g/dL   HCT 37.6  36.0 - 46.0 %   MCV 85.1  78.0 - 100.0 fL   MCH 28.1  26.0 - 34.0 pg    MCHC 33.0  30.0 - 36.0 g/dL   RDW 13.7  11.5 - 15.5 %   Platelets 446 (*) 150 - 400 K/uL  ABO/RH     Status: None   Collection Time    05/25/13  1:20 PM      Result Value Ref Range   ABO/RH(D) B POS    HCG, QUANTITATIVE, PREGNANCY     Status: Abnormal   Collection Time    05/25/13  1:20 PM      Result Value Ref Range   hCG, Beta Chain, Quant, S 8968 (*) <5 mIU/mL   Comment:              GEST. AGE      CONC.  (mIU/mL)       <=1 WEEK        5 - 50         2 WEEKS       50 - 500         3 WEEKS       100 - 10,000         4 WEEKS     1,000 - 30,000         5 WEEKS     3,500 - 115,000       6-8 WEEKS     12,000 - 270,000        12 WEEKS     15,000 - 220,000                FEMALE AND NON-PREGNANT FEMALE:         LESS THAN 5 mIU/mL  CBC     Status: Abnormal   Collection Time    05/27/13  5:06 PM      Result Value Ref Range   WBC 12.0 (*) 4.0 - 10.5 K/uL   RBC 4.20  3.87 - 5.11 MIL/uL   Hemoglobin 12.2  12.0 - 15.0 g/dL   HCT 35.5 (*) 36.0 - 46.0 %   MCV 84.5  78.0 - 100.0 fL   MCH 29.0  26.0 - 34.0 pg   MCHC 34.4  30.0 -  36.0 g/dL   RDW 13.8  11.5 - 15.5 %   Platelets 426 (*) 150 - 400 K/uL  HCG, QUANTITATIVE, PREGNANCY     Status: Abnormal   Collection Time    05/27/13  5:06 PM      Result Value Ref Range   hCG, Beta Chain, Quant, S 5367 (*) <5 mIU/mL   Comment:              GEST. AGE      CONC.  (mIU/mL)       <=1 WEEK        5 - 50         2 WEEKS       50 - 500         3 WEEKS       100 - 10,000         4 WEEKS     1,000 - 30,000         5 WEEKS     3,500 - 115,000       6-8 WEEKS     12,000 - 270,000        12 WEEKS     15,000 - 220,000                FEMALE AND NON-PREGNANT FEMALE:         LESS THAN 5 mIU/mL  CBC     Status: Abnormal   Collection Time    05/27/13  8:17 PM      Result Value Ref Range   WBC 12.9 (*) 4.0 - 10.5 K/uL   RBC 3.55 (*) 3.87 - 5.11 MIL/uL   Hemoglobin 10.1 (*) 12.0 - 15.0 g/dL   Comment: DELTA CHECK NOTED     REPEATED TO VERIFY   HCT  30.0 (*) 36.0 - 46.0 %   MCV 84.5  78.0 - 100.0 fL   MCH 28.5  26.0 - 34.0 pg   MCHC 33.7  30.0 - 36.0 g/dL   RDW 13.7  11.5 - 15.5 %   Platelets 383  150 - 400 K/uL  CBC     Status: Abnormal   Collection Time    06/01/13 11:00 AM      Result Value Ref Range   WBC 5.8  4.0 - 10.5 K/uL   RBC 3.28 (*) 3.87 - 5.11 MIL/uL   Hemoglobin 9.2 (*) 12.0 - 15.0 g/dL   HCT 28.1 (*) 36.0 - 46.0 %   MCV 85.7  78.0 - 100.0 fL   MCH 28.0  26.0 - 34.0 pg   MCHC 32.7  30.0 - 36.0 g/dL   RDW 13.7  11.5 - 15.5 %   Platelets 433 (*) 150 - 400 K/uL  HCG, QUANTITATIVE, PREGNANCY     Status: Abnormal   Collection Time    06/01/13 11:01 AM      Result Value Ref Range   hCG, Beta Chain, Quant, S 323 (*) <5 mIU/mL   Comment:              GEST. AGE      CONC.  (mIU/mL)       <=1 WEEK        5 - 50         2 WEEKS       50 - 500         3 WEEKS       100 - 10,000  4 WEEKS     1,000 - 30,000         5 WEEKS     3,500 - 115,000       6-8 WEEKS     12,000 - 270,000        12 WEEKS     15,000 - 220,000                FEMALE AND NON-PREGNANT FEMALE:         LESS THAN 5 mIU/mL  HCG, QUANTITATIVE, PREGNANCY     Status: None   Collection Time    06/06/13  7:56 AM      Result Value Ref Range   hCG, Beta Chain, Quant, S 48.6     Comment:        Males and non-pregnant females       < 5   mIU/mL      Gestation Age               Concentration (mIU/mL)        <= 1 Week                       5 - 50           2 Weeks                     50 - 500           3 Weeks                    100 - 10,000           4 Weeks                  1,000 - 30,000           5 Weeks                  3,500 - 115,000         6-8 Weeks                 12,000 - 270,000          12 Weeks                 15,000 - 220,000    Assessment:    Lauren [name] is a 38 y.o. female that presents to clinic for follow-up s/p SAB on 05/27/2013. She is doing well with only c/o of HA that is well-controlled with OTC medication. As  expected, her Beta-hCG has decreased from 5367 (05/27/13) to 48.6. (06/06/13). She reports minimal vaginal bleeding that has decreased since last seen in the MAU on 05/27/13.       Plan:    No clinic follow up needed at this time. Patient instructed to go to MAU if she experiences fever, dizziness, abdominal pain, or increase in vaginal bleeding. Pt will start prenatal vitamins as she desires pregnancy. Recommended 3-35months between miscrriage. Patient stated understanding with [place] interpreter.     I saw this patient and agree with PA-students note above.  Fredrik Rigger, MD OB Fellow

## 2013-06-10 NOTE — Patient Instructions (Addendum)
Aborto espontneo  (Miscarriage) El aborto espontneo es la prdida de un beb que no ha nacido (feto) antes de la semana 20 del Media planner. La mayor parte de estos abortos ocurre en los primeros 3 meses. En algunos casos ocurre antes de que la mujer sepa que est [place]. Tambin se denomina "aborto espontneo" o "prdida prematura del embarazo". El aborto espontneo puede ser Ardelia Mems experiencia que afecte emocionalmente a Geologist, engineering. Converse con su mdico si tiene dudas, cmo es el proceso de [place], y sobre planes futuros de Media planner.  CAUSAS   Algunos problemas cromosmicos pueden hacer imposible que el beb se desarrolle normalmente. Los problemas con los genes o cromosomas del beb son generalmente el resultado de errores que se producen, por casualidad, cuando el embrin se divide y crece. Estos problemas no se heredan de los [place].  Infeccin en el cuello del tero.   Problemas hormonales.   Problemas en el cuello del tero, como tener un tero incompetente. Esto ocurre cuando los tejidos no son lo suficientemente fuertes como para Risk manager.   Problemas del tero, como un tero con forma anormal, los fibromas o anormalidades congnitas.   Ciertas enfermedades crnicas.   No fume, no beba alcohol, ni consuma drogas.   Traumatismos  A veces, la causa es desconocida.  SNTOMAS   Sangrado o manchado vaginal, con o sin clicos o dolor.  Dolor o clicos en el abdomen o en la cintura.  Eliminacin de lquido, tejidos o cogulos grandes por la vagina. DIAGNSTICO  El [employer] har un examen fsico. Tambin le indicar una ecografa para confirmar el aborto. Es posible que se realicen anlisis de [place].  TRATAMIENTO   En algunos casos el tratamiento no es necesario, si se eliminan naturalmente todos los tejidos embrionarios que se encontraban en el tero. Si el feto o la placenta quedan dentro del tero (aborto incompleto), pueden infectarse, los tejidos que quedan  pueden infectarse y deben retirarse. Generalmente se realiza un procedimiento de dilatacin y curetaje (D y C). Durante el procedimiento de dilatacin y curetaje, el cuello del tero se abre (dilata) y se retira cualquier resto de tejido fetal o placentario del tero.  Si hay una infeccin, le recetarn antibiticos. Podrn recetarle otros medicamentos para reducir el tamao del tero (contraerlo) si hay una mucho sangrado.  Si su sangre es Rh negativa y su beb es Rh positivo, usted necesitar la inyeccin de inmunoglobulina Rh. Esta inyeccin proteger a los futuros bebs de tener problemas de compatibilidad Rh en futuros embarazos. INSTRUCCIONES PARA EL CUIDADO EN EL HOGAR   El mdico le indicar reposo en cama o le permitir Automotive engineer. Vuelva a la actividad lentamente o segn las indicaciones de su mdico.  Pdale a alguien que la ayude con las responsabilidades familiares y del hogar durante este tiempo.   Lleve un registro de la cantidad y la saturacin de las toallas higinicas que Medical laboratory scientific officer. Anote esta informacin   No use tampones. No No se haga duchas vaginales ni tenga relaciones sexuales hasta que el mdico la autorice.   Slo tome medicamentos de venta libre o recetados para Glass blower/designer o Health and safety inspector, segn las indicaciones de su mdico.   No tome aspirina. La aspirina puede ocasionar hemorragias.   Concurra puntualmente a las citas de control con el mdico.   Si usted o su pareja tienen dificultades con el duelo, hable con su mdico para buscar la ayuda psicolgica que los ayude a enfrentar la prdida  del embarazo. Permítase el tiempo suficiente de duelo antes de quedar embarazada nuevamente.   °SOLICITE ATENCIÓN MÉDICA DE INMEDIATO SI:  °· Siente calambres intensos o dolor en la espalda o en el abdomen. °· Tiene fiebre. °· Elimina grandes coágulos de sangre (del tamaño de una nuez o más) o tejidos por la vagina. Guarde lo que ha eliminado para  que su médico lo examine.   °· La hemorragia aumenta.   °· Observa una secreción vaginal espesa y con mal olor. °· Se siente mareada, débil, o se desmaya.   °· Siente escalofríos.   °ASEGÚRESE DE QUE:  °· Comprende estas instrucciones. °· Controlará su enfermedad. °· Solicitará ayuda de inmediato si no mejora o si empeora. °Document Released: 01/19/2005 Document Revised: 08/06/2012 °Exit[employer]® Patient Information ©2014 [employer]. ° °

## 2013-06-11 ENCOUNTER — Telehealth: Payer: Self-pay | Admitting: *Deleted

## 2013-06-11 NOTE — Telephone Encounter (Signed)
Per chart review and discussion with Dr. Elly Modena needs one more bhcg later this week.

## 2013-06-11 NOTE — Telephone Encounter (Signed)
Lauren [name] with interpreter and notified her we need to draw another lab bhcg- agreed to come in 06/14/13 0800 for bhcg.

## 2013-06-14 ENCOUNTER — Other Ambulatory Visit: Payer: Self-pay

## 2013-06-14 DIAGNOSIS — O039 Complete or unspecified spontaneous abortion without complication: Secondary | ICD-10-CM

## 2013-06-15 LAB — HCG, QUANTITATIVE, PREGNANCY: HCG, BETA CHAIN, QUANT, S: 3.8 m[IU]/mL

## 2013-07-09 ENCOUNTER — Ambulatory Visit: Payer: Self-pay | Attending: Internal Medicine

## 2013-08-12 ENCOUNTER — Ambulatory Visit: Payer: Self-pay | Admitting: Internal Medicine

## 2014-02-24 ENCOUNTER — Encounter: Payer: Self-pay | Admitting: Family Medicine

## 2015-12-14 ENCOUNTER — Encounter (HOSPITAL_COMMUNITY): Payer: Self-pay | Admitting: *Deleted

## 2015-12-14 ENCOUNTER — Inpatient Hospital Stay (HOSPITAL_COMMUNITY)
Admission: AD | Admit: 2015-12-14 | Discharge: 2015-12-14 | Disposition: A | Discharge status: Home / Self Care | Payer: Self-pay | Source: Ambulatory Visit | Attending: Family Medicine | Admitting: Family Medicine

## 2015-12-14 DIAGNOSIS — W1830XA Fall on same level, unspecified, initial encounter: Secondary | ICD-10-CM

## 2015-12-14 DIAGNOSIS — O9A212 Injury, poisoning and certain other consequences of external causes complicating pregnancy, second trimester: Secondary | ICD-10-CM

## 2015-12-14 DIAGNOSIS — S99922A Unspecified injury of left foot, initial encounter: Secondary | ICD-10-CM | POA: Insufficient documentation

## 2015-12-14 DIAGNOSIS — W19XXXA Unspecified fall, initial encounter: Secondary | ICD-10-CM | POA: Insufficient documentation

## 2015-12-14 DIAGNOSIS — O26892 Other specified pregnancy related conditions, second trimester: Secondary | ICD-10-CM | POA: Insufficient documentation

## 2015-12-14 DIAGNOSIS — Z3A15 15 weeks gestation of pregnancy: Secondary | ICD-10-CM | POA: Insufficient documentation

## 2015-12-14 MED ORDER — ACETAMINOPHEN 325 MG PO TABS
650.0000 mg | ORAL_TABLET | Freq: Once | ORAL | Status: AC
  Administered 2015-12-14: 650 mg via ORAL
  Filled 2015-12-14: qty 2

## 2015-12-14 NOTE — MAU Note (Addendum)
Pt states she fell on her L side this morning.  [place] coming down steps.  Denies abdominal pain or bleeding.  Pt is limping, C/O pain in L foot.

## 2015-12-14 NOTE — MAU Provider Note (Signed)
History   [id number]   Chief Complaint  Patient presents with  . Fall    HPI Lauren [name] is a 40 y.o. female  [phone] at [redacted]w[redacted]d IUP here with report of a fall this afternoon.  Reports falling on left side.   Denies vaginal bleeding or cramping.  Here to make sure baby is okay.  Also reports pain on left foot.  +ambulation, declines xray.    Patient's last menstrual period was 08/28/2015.  OB History  Gravida Para Term Preterm AB Living  6 3 3  0 1 3  SAB TAB Ectopic Multiple Live Births  0 0 1 0 3    # Outcome Date GA Lbr Len/2nd Weight Sex Delivery Anes PTL Lv  6 Current           5 Term 01/21/12 [redacted]w[redacted]d 53:14 / 05:51 8 lb 12.6 oz (3.986 kg) F Vag-Spont EPI  LIV     Birth Comments: none  4 Term 2011 [redacted]w[redacted]d 08:00 8 lb (3.629 kg) F Vag-Spont EPI  LIV     Birth Comments: fibroids, thrombocytosis, uti  3 Ectopic 2005          2 Term 1999 [redacted]w[redacted]d 06:00  M Vag-Spont EPI  LIV  1 Gravida              Birth Comments: System Generated. Please review and update pregnancy details.      Past Medical History:  Diagnosis Date  . Abnormal Pap smear   . Headache(784.0)   . Hx of chlamydia infection   . Hx of ectopic pregnancy   . Hx: UTI (urinary tract infection)   . Language barrier   . Late prenatal care   . Thrombocytosis (HCC)     Family History  Problem Relation Age of Onset  . Seizures Son   . Hypertension Son     Social History   Social History  . Marital status: Significant Other    Spouse name: N/A  . Number of children: N/A  . Years of education: N/A   Social History Main Topics  . Smoking status: Never Smoker  . Smokeless tobacco: Never Used  . Alcohol use No  . Drug use: No  . Sexual activity: Not Currently    Birth control/ protection: None   Other Topics Concern  . None   Social History Narrative  . None    No Known Allergies  No current facility-administered medications on file prior to encounter.    Current Outpatient Prescriptions  on File Prior to Encounter  Medication Sig Dispense Refill  . Prenatal Multivit-Min-Fe-FA (PRENATAL VITAMINS) 0.8 MG tablet Take 1 tablet by mouth daily. 90 tablet 3     Review of Systems  Gastrointestinal: Negative for abdominal pain.  Genitourinary: Negative for pelvic pain and vaginal bleeding.  Musculoskeletal:       Left foot pain  All other systems reviewed and are negative.    Physical Exam   Vitals:   12/14/15 1210  BP: 109/60  Resp: 18  Temp: 98.5 F (36.9 C)  TempSrc: Oral    Physical Exam  Constitutional: She is oriented to person, place, and time. She appears well-developed and well-nourished.  HENT:  Head: Normocephalic.  Neck: Normal range of motion. Neck supple.  Cardiovascular: Normal rate, regular rhythm and normal heart sounds.   Respiratory: Effort normal and breath sounds normal. No respiratory distress.  GI: Soft. There is no tenderness.  Genitourinary: No bleeding in the vagina.  Musculoskeletal: Normal range of  motion. She exhibits no edema.       Left ankle: She exhibits swelling. She exhibits normal range of motion, no deformity and normal pulse. Tenderness. Achilles tendon exhibits no pain and no defect.       Feet:  Neurological: She is alert and oriented to person, place, and time.  Skin: Skin is warm and dry.    FHR 150  MAU Course  Procedures  MDM Tylenol 650 mg PO > pt reports improvement in pain   Assessment and Plan  40 y.o. [id number] at [redacted]w[redacted]d IUP  Fall  Left Foot Injury  Plan: Discharge home Rest foot Apply ice for 20 min three times a day Elevate foot Tylenol prn  Gwen Pounds, CNM 12/14/2015 3:35 PM

## 2015-12-21 LAB — OB RESULTS CONSOLE HIV ANTIBODY (ROUTINE TESTING): HIV: NONREACTIVE

## 2015-12-21 LAB — OB RESULTS CONSOLE ANTIBODY SCREEN: Antibody Screen: NEGATIVE

## 2015-12-21 LAB — OB RESULTS CONSOLE GC/CHLAMYDIA
Chlamydia: NEGATIVE
Gonorrhea: NEGATIVE

## 2015-12-21 LAB — OB RESULTS CONSOLE ABO/RH: RH Type: POSITIVE

## 2015-12-21 LAB — OB RESULTS CONSOLE RUBELLA ANTIBODY, IGM: RUBELLA: IMMUNE

## 2015-12-21 LAB — OB RESULTS CONSOLE RPR: RPR: NONREACTIVE

## 2015-12-21 LAB — OB RESULTS CONSOLE HEPATITIS B SURFACE ANTIGEN: HEP B S AG: NEGATIVE

## 2016-05-13 LAB — OB RESULTS CONSOLE GBS: GBS: NEGATIVE

## 2016-06-06 ENCOUNTER — Encounter (HOSPITAL_COMMUNITY): Payer: Self-pay | Admitting: *Deleted

## 2016-06-06 ENCOUNTER — Telehealth (HOSPITAL_COMMUNITY): Payer: Self-pay | Admitting: *Deleted

## 2016-06-06 NOTE — Telephone Encounter (Signed)
Interpreter number [phone] Preadmission screen

## 2016-06-07 ENCOUNTER — Encounter (HOSPITAL_COMMUNITY): Payer: Self-pay

## 2016-06-07 ENCOUNTER — Inpatient Hospital Stay (HOSPITAL_COMMUNITY)
Admission: AD | Admit: 2016-06-07 | Discharge: 2016-06-10 | DRG: 765 | Disposition: A | Discharge status: Home / Self Care | Payer: Medicaid Other | Source: Ambulatory Visit | Attending: Obstetrics and Gynecology | Admitting: Obstetrics and Gynecology

## 2016-06-07 DIAGNOSIS — D649 Anemia, unspecified: Secondary | ICD-10-CM | POA: Diagnosis not present

## 2016-06-07 DIAGNOSIS — O9081 Anemia of the puerperium: Secondary | ICD-10-CM | POA: Diagnosis not present

## 2016-06-07 DIAGNOSIS — O99214 Obesity complicating childbirth: Secondary | ICD-10-CM | POA: Diagnosis present

## 2016-06-07 DIAGNOSIS — Z8249 Family history of ischemic heart disease and other diseases of the circulatory system: Secondary | ICD-10-CM | POA: Diagnosis not present

## 2016-06-07 DIAGNOSIS — Z3A40 40 weeks gestation of pregnancy: Secondary | ICD-10-CM | POA: Diagnosis not present

## 2016-06-07 DIAGNOSIS — Z3493 Encounter for supervision of normal pregnancy, unspecified, third trimester: Secondary | ICD-10-CM | POA: Diagnosis present

## 2016-06-07 DIAGNOSIS — Z6835 Body mass index (BMI) 35.0-35.9, adult: Secondary | ICD-10-CM

## 2016-06-07 DIAGNOSIS — Z3490 Encounter for supervision of normal pregnancy, unspecified, unspecified trimester: Secondary | ICD-10-CM

## 2016-06-07 DIAGNOSIS — O4593 Premature separation of placenta, unspecified, third trimester: Secondary | ICD-10-CM | POA: Diagnosis present

## 2016-06-07 LAB — CBC
HCT: 37 % (ref 36.0–46.0)
HEMOGLOBIN: 12.6 g/dL (ref 12.0–15.0)
MCH: 29.7 pg (ref 26.0–34.0)
MCHC: 34.1 g/dL (ref 30.0–36.0)
MCV: 87.3 fL (ref 78.0–100.0)
Platelets: 392 10*3/uL (ref 150–400)
RBC: 4.24 MIL/uL (ref 3.87–5.11)
RDW: 15.1 % (ref 11.5–15.5)
WBC: 9.5 10*3/uL (ref 4.0–10.5)

## 2016-06-07 MED ORDER — OXYCODONE-ACETAMINOPHEN 5-325 MG PO TABS: 2.0000 | ORAL_TABLET | ORAL | Status: DC | PRN

## 2016-06-07 MED ORDER — FENTANYL 2.5 MCG/ML BUPIVACAINE 1/10 % EPIDURAL INFUSION (WH - ANES)
14.0000 mL/h | INTRAMUSCULAR | Status: DC | PRN
  Administered 2016-06-08 (×2): 14 mL/h via EPIDURAL
  Filled 2016-06-07 (×2): qty 100

## 2016-06-07 MED ORDER — SOD CITRATE-CITRIC ACID 500-334 MG/5ML PO SOLN: 30.0000 mL | ORAL | Status: DC | PRN

## 2016-06-07 MED ORDER — LACTATED RINGERS IV SOLN
500.0000 mL | Freq: Once | INTRAVENOUS | Status: AC
  Administered 2016-06-08: 500 mL via INTRAVENOUS

## 2016-06-07 MED ORDER — LACTATED RINGERS IV SOLN
INTRAVENOUS | Status: DC
  Administered 2016-06-07 – 2016-06-08 (×5): via INTRAVENOUS

## 2016-06-07 MED ORDER — EPHEDRINE 5 MG/ML INJ: 10.0000 mg | INTRAVENOUS | Status: DC | PRN

## 2016-06-07 MED ORDER — DIPHENHYDRAMINE HCL 50 MG/ML IJ SOLN: 12.5000 mg | INTRAMUSCULAR | Status: DC | PRN

## 2016-06-07 MED ORDER — LIDOCAINE HCL (PF) 1 % IJ SOLN
30.0000 mL | INTRAMUSCULAR | Status: DC | PRN
  Filled 2016-06-07: qty 30

## 2016-06-07 MED ORDER — FLEET ENEMA 7-19 GM/118ML RE ENEM: 1.0000 | ENEMA | RECTAL | Status: DC | PRN

## 2016-06-07 MED ORDER — OXYCODONE-ACETAMINOPHEN 5-325 MG PO TABS: 1.0000 | ORAL_TABLET | ORAL | Status: DC | PRN

## 2016-06-07 MED ORDER — ONDANSETRON HCL 4 MG/2ML IJ SOLN: 4.0000 mg | Freq: Four times a day (QID) | INTRAMUSCULAR | Status: DC | PRN

## 2016-06-07 MED ORDER — LACTATED RINGERS IV SOLN: 500.0000 mL | INTRAVENOUS | Status: DC | PRN

## 2016-06-07 MED ORDER — OXYTOCIN BOLUS FROM INFUSION: 500.0000 mL | Freq: Once | INTRAVENOUS | Status: DC

## 2016-06-07 MED ORDER — ACETAMINOPHEN 325 MG PO TABS: 650.0000 mg | ORAL_TABLET | ORAL | Status: DC | PRN

## 2016-06-07 MED ORDER — OXYTOCIN 40 UNITS IN LACTATED RINGERS INFUSION - SIMPLE MED
2.5000 [IU]/h | INTRAVENOUS | Status: DC
  Filled 2016-06-07: qty 1000

## 2016-06-07 MED ORDER — PHENYLEPHRINE 40 MCG/ML (10ML) SYRINGE FOR IV PUSH (FOR BLOOD PRESSURE SUPPORT)
80.0000 ug | PREFILLED_SYRINGE | INTRAVENOUS | Status: DC | PRN
  Filled 2016-06-07: qty 10

## 2016-06-07 NOTE — H&P (Signed)
LABOR AND DELIVERY ADMISSION HISTORY AND PHYSICAL NOTE  Tailynn Prestegui-Bedolla is a 41 y.o. female [phone] with IUP at [redacted]w[redacted]d by LMP presenting for labor. Reports contractions that started around 1800 and bloody show this morning. Denies HA, changes vision, CP, SOB, NVD.  Does endorse LE swelling and swelling in her face in last couple of weeks. She reports positive fetal movement. She denies leakage of fluid or vaginal bleeding.  Prenatal History/Complications:  Past Medical History: Past Medical History:  Diagnosis Date  . Abnormal Pap smear   . AMA (advanced maternal age) multigravida 23+   . Headache(784.0)   . Hx of chlamydia infection   . Hx of ectopic pregnancy   . Hx: UTI (urinary tract infection)   . Language barrier   . Late prenatal care   . Thrombocytosis ([place])   . Vaginal Pap smear, abnormal     Past Surgical History: Past Surgical History:  Procedure Laterality Date  . APPENDECTOMY    . CHOLECYSTECTOMY N/A 10/19/2012   Procedure: LAPAROSCOPIC CHOLECYSTECTOMY WITH INTRAOPERATIVE CHOLANGIOGRAM;  Surgeon: Shann Medal, MD;  Location: [facility];  Service: General;  Laterality: N/A;  . ECTOPIC PREGNANCY SURGERY  6years ago  . SALPINGECTOMY      Obstetrical History: OB History    Gravida Para Term Preterm AB Living   6 3 3  0 1 3   SAB TAB Ectopic Multiple Live Births   0 0 1 0 3      Social History: Social History   Social History  . Marital status: Significant Other    Spouse name: N/A  . Number of children: N/A  . Years of education: N/A   Social History Main Topics  . Smoking status: Never Smoker  . Smokeless tobacco: Never Used  . Alcohol use No  . Drug use: No  . Sexual activity: Not Currently    Birth control/ protection: None   Other Topics Concern  . None   Social History Narrative  . None    Family History: Family History  Problem Relation Age of Onset  . Seizures Son   . Hypertension Son     Allergies: No Known  Allergies  Prescriptions Prior to Admission  Medication Sig Dispense Refill Last Dose  . aspirin EC 81 MG tablet Take 81 mg by mouth daily.   06/07/2016 at Unknown time  . Prenatal Multivit-Min-Fe-FA (PRENATAL VITAMINS) 0.8 MG tablet Take 1 tablet by mouth daily. 90 tablet 3 06/07/2016 at Unknown time     Review of Systems   All systems reviewed and negative except as stated in HPI  Last menstrual period 08/28/2015. General appearance: alert and cooperative Lungs: clear to auscultation bilaterally Heart: regular rate and rhythm Abdomen: soft, non-tender; bowel sounds normal Extremities: No calf swelling or tenderness Presentation: cephalic Fetal monitoring:  Uterine activity:  FHR baseline 130s, mod variability, pos accels, no decels, contractions q3-79min Dilation: 4 Effacement (%): 70 Station: -3 Exam by:: Passenger transport manager RN   Prenatal labs: ABO, Rh: B/Positive/-- (08/28 0000) Antibody: Negative (08/28 0000) Rubella: immune RPR: Nonreactive (08/28 0000)  HBsAg: Negative (08/28 0000)  HIV: Non-reactive (08/28 0000)  GBS: Negative (01/19 0000)  1 hr Glucola: normal Genetic screening:  normal Anatomy US: normal  Prenatal Transfer Tool  Maternal Diabetes: No Genetic Screening: Normal Maternal Ultrasounds/Referrals: Normal Fetal Ultrasounds or other Referrals:  None Maternal Substance Abuse:  No Significant Maternal Medications:  None Significant Maternal Lab Results: None  No results found for this or any previous visit (  from the past 24 hour(s)).  Patient Active Problem List   Diagnosis Date Noted  . Gall bladder disease 10/17/2012    Assessment: Kailynn Prestegui-Bedolla is a 41 y.o. [id number] at [redacted]w[redacted]d here for SOL. Denies vaginal bleeding or LOF.   #Labor: SOL, continue with expectant management, anticipate SVD #Pain: epdiural #FWB: Cat 1 #ID: GBS neg #MOF: breast/bottle #MOC: depo #Circ: NA  Eloise Levels, MD PGY-1 06/07/2016, 9:13 PM   OB FELLOW  HISTORY AND PHYSICAL ATTESTATION  I have seen and examined this patient; I agree with above documentation in the resident's note.    Katherine Basset, DO OB Fellow

## 2016-06-07 NOTE — MAU Note (Signed)
Vertex presentation confirmed by bedside US by Dr. Baron Sane.

## 2016-06-07 NOTE — MAU Note (Signed)
Pt presents complaining of contractions every 5-10 minutes for an hour. Denies leaking or bleeding. Reports good fetal movement.

## 2016-06-07 NOTE — Progress Notes (Signed)
Limited bedside ultrasound performed to confirm vertex presentation. Vertex presentation was confirmed.  Jacquiline Doe, MD' 06/07/2016 [id number]

## 2016-06-08 ENCOUNTER — Encounter (HOSPITAL_COMMUNITY): Payer: Self-pay

## 2016-06-08 ENCOUNTER — Inpatient Hospital Stay (HOSPITAL_COMMUNITY): Payer: Medicaid Other | Admitting: Anesthesiology

## 2016-06-08 ENCOUNTER — Encounter (HOSPITAL_COMMUNITY)
Admission: AD | Disposition: A | Discharge status: Home / Self Care | Payer: Self-pay | Source: Ambulatory Visit | Attending: Obstetrics and Gynecology

## 2016-06-08 DIAGNOSIS — Z3A40 40 weeks gestation of pregnancy: Secondary | ICD-10-CM

## 2016-06-08 DIAGNOSIS — O368330 Maternal care for abnormalities of the fetal heart rate or rhythm, third trimester, not applicable or unspecified: Secondary | ICD-10-CM

## 2016-06-08 LAB — CBC
HEMATOCRIT: 27 % — AB (ref 36.0–46.0)
Hemoglobin: 9.2 g/dL — ABNORMAL LOW (ref 12.0–15.0)
MCH: 30.3 pg (ref 26.0–34.0)
MCHC: 34.1 g/dL (ref 30.0–36.0)
MCV: 88.8 fL (ref 78.0–100.0)
Platelets: 305 10*3/uL (ref 150–400)
RBC: 3.04 MIL/uL — ABNORMAL LOW (ref 3.87–5.11)
RDW: 15.2 % (ref 11.5–15.5)
WBC: 12.4 10*3/uL — ABNORMAL HIGH (ref 4.0–10.5)

## 2016-06-08 LAB — RPR: RPR: NONREACTIVE

## 2016-06-08 SURGERY — Surgical Case
Anesthesia: Epidural

## 2016-06-08 MED ORDER — OXYTOCIN 40 UNITS IN LACTATED RINGERS INFUSION - SIMPLE MED
1.0000 m[IU]/min | INTRAVENOUS | Status: DC
  Administered 2016-06-08: 2 m[IU]/min via INTRAVENOUS

## 2016-06-08 MED ORDER — CEFAZOLIN SODIUM-DEXTROSE 2-3 GM-% IV SOLR
INTRAVENOUS | Status: DC | PRN
  Administered 2016-06-08: 2 g via INTRAVENOUS

## 2016-06-08 MED ORDER — MEPERIDINE HCL 25 MG/ML IJ SOLN: 6.2500 mg | INTRAMUSCULAR | Status: DC | PRN

## 2016-06-08 MED ORDER — METOCLOPRAMIDE HCL 5 MG/ML IJ SOLN
INTRAMUSCULAR | Status: AC
  Filled 2016-06-08: qty 2

## 2016-06-08 MED ORDER — OXYCODONE HCL 5 MG PO TABS: 5.0000 mg | ORAL_TABLET | ORAL | Status: DC | PRN

## 2016-06-08 MED ORDER — ERYTHROMYCIN 5 MG/GM OP OINT
TOPICAL_OINTMENT | OPHTHALMIC | Status: AC
  Filled 2016-06-08: qty 1

## 2016-06-08 MED ORDER — SENNOSIDES-DOCUSATE SODIUM 8.6-50 MG PO TABS
2.0000 | ORAL_TABLET | ORAL | Status: DC
  Administered 2016-06-08 – 2016-06-10 (×2): 2 via ORAL
  Filled 2016-06-08 (×2): qty 2

## 2016-06-08 MED ORDER — SIMETHICONE 80 MG PO CHEW
80.0000 mg | CHEWABLE_TABLET | Freq: Three times a day (TID) | ORAL | Status: DC
  Administered 2016-06-09 – 2016-06-10 (×4): 80 mg via ORAL
  Filled 2016-06-08 (×4): qty 1

## 2016-06-08 MED ORDER — PRENATAL MULTIVITAMIN CH
1.0000 | ORAL_TABLET | Freq: Every day | ORAL | Status: DC
  Administered 2016-06-09 – 2016-06-10 (×2): 1 via ORAL
  Filled 2016-06-08 (×2): qty 1

## 2016-06-08 MED ORDER — LACTATED RINGERS IV SOLN
INTRAVENOUS | Status: DC | PRN
Start: 2016-06-08 — End: 2016-06-10
  Administered 2016-06-08: 16:00:00 via INTRAVENOUS

## 2016-06-08 MED ORDER — LIDOCAINE HCL (PF) 1 % IJ SOLN
INTRAMUSCULAR | Status: DC | PRN
  Administered 2016-06-08 (×2): 6 mL via EPIDURAL

## 2016-06-08 MED ORDER — TETANUS-DIPHTH-ACELL PERTUSSIS 5-2.5-18.5 LF-MCG/0.5 IM SUSP: 0.5000 mL | Freq: Once | INTRAMUSCULAR | Status: DC

## 2016-06-08 MED ORDER — SCOPOLAMINE 1 MG/3DAYS TD PT72
1.0000 | MEDICATED_PATCH | Freq: Once | TRANSDERMAL | Status: DC
  Filled 2016-06-08: qty 1

## 2016-06-08 MED ORDER — DIPHENHYDRAMINE HCL 50 MG/ML IJ SOLN: 12.5000 mg | INTRAMUSCULAR | Status: DC | PRN

## 2016-06-08 MED ORDER — LIDOCAINE-EPINEPHRINE (PF) 2 %-1:200000 IJ SOLN
INTRAMUSCULAR | Status: AC
  Filled 2016-06-08: qty 20

## 2016-06-08 MED ORDER — MORPHINE SULFATE (PF) 0.5 MG/ML IJ SOLN
INTRAMUSCULAR | Status: DC | PRN
  Administered 2016-06-08: 3 mg via EPIDURAL

## 2016-06-08 MED ORDER — MENTHOL 3 MG MT LOZG: 1.0000 | LOZENGE | OROMUCOSAL | Status: DC | PRN

## 2016-06-08 MED ORDER — SODIUM CHLORIDE 0.9 % IR SOLN
Status: DC | PRN
Start: 2016-06-08 — End: 2016-06-08
  Administered 2016-06-08: 1

## 2016-06-08 MED ORDER — NALBUPHINE HCL 10 MG/ML IJ SOLN: 5.0000 mg | Freq: Once | INTRAMUSCULAR | Status: DC | PRN

## 2016-06-08 MED ORDER — SIMETHICONE 80 MG PO CHEW
80.0000 mg | CHEWABLE_TABLET | ORAL | Status: DC
  Administered 2016-06-08 – 2016-06-10 (×2): 80 mg via ORAL
  Filled 2016-06-08 (×2): qty 1

## 2016-06-08 MED ORDER — SIMETHICONE 80 MG PO CHEW: 80.0000 mg | CHEWABLE_TABLET | ORAL | Status: DC | PRN

## 2016-06-08 MED ORDER — OXYCODONE HCL 5 MG PO TABS
10.0000 mg | ORAL_TABLET | ORAL | Status: DC | PRN
  Administered 2016-06-09 – 2016-06-10 (×5): 10 mg via ORAL
  Filled 2016-06-08 (×5): qty 2

## 2016-06-08 MED ORDER — SOD CITRATE-CITRIC ACID 500-334 MG/5ML PO SOLN
ORAL | Status: AC
  Administered 2016-06-08: 30 mL
  Filled 2016-06-08: qty 15

## 2016-06-08 MED ORDER — ACETAMINOPHEN 325 MG PO TABS
650.0000 mg | ORAL_TABLET | ORAL | Status: DC | PRN
  Administered 2016-06-10: 650 mg via ORAL
  Filled 2016-06-08: qty 2

## 2016-06-08 MED ORDER — SODIUM BICARBONATE 8.4 % IV SOLN
INTRAVENOUS | Status: DC | PRN
  Administered 2016-06-08: 3 mL via EPIDURAL
  Administered 2016-06-08: 10 mL via EPIDURAL

## 2016-06-08 MED ORDER — ACETAMINOPHEN 500 MG PO TABS
1000.0000 mg | ORAL_TABLET | Freq: Four times a day (QID) | ORAL | Status: AC
  Administered 2016-06-08 – 2016-06-09 (×4): 1000 mg via ORAL
  Filled 2016-06-08 (×4): qty 2

## 2016-06-08 MED ORDER — LACTATED RINGERS IV SOLN
INTRAVENOUS | Status: DC | PRN
Start: 2016-06-08 — End: 2016-06-10
  Administered 2016-06-08 (×4): via INTRAVENOUS

## 2016-06-08 MED ORDER — ONDANSETRON HCL 4 MG/2ML IJ SOLN
4.0000 mg | Freq: Three times a day (TID) | INTRAMUSCULAR | Status: DC | PRN
  Administered 2016-06-09: 4 mg via INTRAVENOUS
  Filled 2016-06-08 (×2): qty 2

## 2016-06-08 MED ORDER — DEXAMETHASONE SODIUM PHOSPHATE 10 MG/ML IJ SOLN
INTRAMUSCULAR | Status: DC | PRN
  Administered 2016-06-08: 4 mg via INTRAVENOUS

## 2016-06-08 MED ORDER — EPHEDRINE 5 MG/ML INJ: 10.0000 mg | INTRAVENOUS | Status: DC | PRN

## 2016-06-08 MED ORDER — COCONUT OIL OIL: 1.0000 "application " | TOPICAL_OIL | Status: DC | PRN

## 2016-06-08 MED ORDER — DEXAMETHASONE SODIUM PHOSPHATE 4 MG/ML IJ SOLN
INTRAMUSCULAR | Status: AC
  Filled 2016-06-08: qty 1

## 2016-06-08 MED ORDER — SODIUM BICARBONATE 8.4 % IV SOLN
INTRAVENOUS | Status: AC
  Filled 2016-06-08: qty 50

## 2016-06-08 MED ORDER — NALOXONE HCL 0.4 MG/ML IJ SOLN: 0.4000 mg | INTRAMUSCULAR | Status: DC | PRN

## 2016-06-08 MED ORDER — ONDANSETRON HCL 4 MG/2ML IJ SOLN
INTRAMUSCULAR | Status: DC | PRN
  Administered 2016-06-08: 4 mg via INTRAVENOUS

## 2016-06-08 MED ORDER — OXYTOCIN 40 UNITS IN LACTATED RINGERS INFUSION - SIMPLE MED
INTRAVENOUS | Status: DC | PRN
  Administered 2016-06-08: 40 mL via INTRAVENOUS

## 2016-06-08 MED ORDER — DIPHENHYDRAMINE HCL 25 MG PO CAPS: 25.0000 mg | ORAL_CAPSULE | ORAL | Status: DC | PRN

## 2016-06-08 MED ORDER — LACTATED RINGERS IV SOLN
INTRAVENOUS | Status: DC
  Administered 2016-06-09: 08:00:00 via INTRAVENOUS

## 2016-06-08 MED ORDER — PHENYLEPHRINE HCL 10 MG/ML IJ SOLN
INTRAMUSCULAR | Status: DC | PRN
  Administered 2016-06-08: 80 ug via INTRAVENOUS
  Administered 2016-06-08 (×3): 40 ug via INTRAVENOUS

## 2016-06-08 MED ORDER — PHENYLEPHRINE 40 MCG/ML (10ML) SYRINGE FOR IV PUSH (FOR BLOOD PRESSURE SUPPORT): 80.0000 ug | PREFILLED_SYRINGE | INTRAVENOUS | Status: DC | PRN

## 2016-06-08 MED ORDER — ONDANSETRON HCL 4 MG/2ML IJ SOLN
INTRAMUSCULAR | Status: AC
Start: 2016-06-08 — End: 2016-06-08
  Filled 2016-06-08: qty 2

## 2016-06-08 MED ORDER — NALBUPHINE HCL 10 MG/ML IJ SOLN: 5.0000 mg | INTRAMUSCULAR | Status: DC | PRN

## 2016-06-08 MED ORDER — DIPHENHYDRAMINE HCL 25 MG PO CAPS: 25.0000 mg | ORAL_CAPSULE | Freq: Four times a day (QID) | ORAL | Status: DC | PRN

## 2016-06-08 MED ORDER — TERBUTALINE SULFATE 1 MG/ML IJ SOLN
0.2500 mg | Freq: Once | INTRAMUSCULAR | Status: AC | PRN
  Administered 2016-06-08: 0.25 mg via SUBCUTANEOUS
  Filled 2016-06-08: qty 1

## 2016-06-08 MED ORDER — WITCH HAZEL-GLYCERIN EX PADS: 1.0000 "application " | MEDICATED_PAD | CUTANEOUS | Status: DC | PRN

## 2016-06-08 MED ORDER — OXYTOCIN 40 UNITS IN LACTATED RINGERS INFUSION - SIMPLE MED: 2.5000 [IU]/h | INTRAVENOUS | Status: AC

## 2016-06-08 MED ORDER — METOCLOPRAMIDE HCL 5 MG/ML IJ SOLN
INTRAMUSCULAR | Status: DC | PRN
  Administered 2016-06-08: 10 mg via INTRAVENOUS

## 2016-06-08 MED ORDER — OXYTOCIN 10 UNIT/ML IJ SOLN
INTRAMUSCULAR | Status: AC
  Filled 2016-06-08: qty 4

## 2016-06-08 MED ORDER — IBUPROFEN 600 MG PO TABS
600.0000 mg | ORAL_TABLET | Freq: Four times a day (QID) | ORAL | Status: DC
  Administered 2016-06-08 – 2016-06-10 (×8): 600 mg via ORAL
  Filled 2016-06-08 (×7): qty 1

## 2016-06-08 MED ORDER — LACTATED RINGERS IV SOLN: 500.0000 mL | Freq: Once | INTRAVENOUS | Status: DC

## 2016-06-08 MED ORDER — SODIUM CHLORIDE 0.9% FLUSH: 3.0000 mL | INTRAVENOUS | Status: DC | PRN

## 2016-06-08 MED ORDER — MORPHINE SULFATE (PF) 0.5 MG/ML IJ SOLN
INTRAMUSCULAR | Status: AC
  Filled 2016-06-08: qty 10

## 2016-06-08 MED ORDER — DIBUCAINE 1 % RE OINT: 1.0000 "application " | TOPICAL_OINTMENT | RECTAL | Status: DC | PRN

## 2016-06-08 MED ORDER — FENTANYL CITRATE (PF) 100 MCG/2ML IJ SOLN: 25.0000 ug | INTRAMUSCULAR | Status: DC | PRN

## 2016-06-08 MED ORDER — NALOXONE HCL 2 MG/2ML IJ SOSY
1.0000 ug/kg/h | PREFILLED_SYRINGE | INTRAVENOUS | Status: DC | PRN
  Filled 2016-06-08: qty 2

## 2016-06-08 SURGICAL SUPPLY — 33 items
CHLORAPREP W/TINT 26ML (MISCELLANEOUS) ×3 IMPLANT
CLAMP CORD UMBIL (MISCELLANEOUS) IMPLANT
CONTAINER PREFILL 10% NBF 15ML (MISCELLANEOUS) IMPLANT
DRSG OPSITE POSTOP 4X10 (GAUZE/BANDAGES/DRESSINGS) ×3 IMPLANT
ELECT REM PT RETURN 9FT ADLT (ELECTROSURGICAL) ×3
ELECTRODE REM PT RTRN 9FT ADLT (ELECTROSURGICAL) ×1 IMPLANT
EXTRACTOR VACUUM M CUP 4 TUBE (SUCTIONS) IMPLANT
EXTRACTOR VACUUM M CUP 4' TUBE (SUCTIONS)
GLOVE BIOGEL PI IND STRL 6.5 (GLOVE) ×1 IMPLANT
GLOVE BIOGEL PI IND STRL 7.0 (GLOVE) ×1 IMPLANT
GLOVE BIOGEL PI INDICATOR 6.5 (GLOVE) ×2
GLOVE BIOGEL PI INDICATOR 7.0 (GLOVE) ×2
GLOVE SURG SS PI 6.0 STRL IVOR (GLOVE) ×3 IMPLANT
GOWN STRL REUS W/TWL LRG LVL3 (GOWN DISPOSABLE) ×6 IMPLANT
HEMOSTAT ARISTA ABSORB 3G PWDR (MISCELLANEOUS) ×6 IMPLANT
KIT ABG SYR 3ML LUER SLIP (SYRINGE) IMPLANT
NEEDLE HYPO 25X5/8 SAFETYGLIDE (NEEDLE) IMPLANT
NS IRRIG 1000ML POUR BTL (IV SOLUTION) ×3 IMPLANT
PACK C SECTION WH (CUSTOM PROCEDURE TRAY) ×3 IMPLANT
PAD ABD 7.5X8 STRL (GAUZE/BANDAGES/DRESSINGS) ×3 IMPLANT
PAD OB MATERNITY 4.3X12.25 (PERSONAL CARE ITEMS) ×3 IMPLANT
PENCIL SMOKE EVAC W/HOLSTER (ELECTROSURGICAL) ×3 IMPLANT
RTRCTR C-SECT PINK 25CM LRG (MISCELLANEOUS) IMPLANT
SEPRAFILM MEMBRANE 5X6 (MISCELLANEOUS) IMPLANT
SPONGE GAUZE 4X4 12PLY STER LF (GAUZE/BANDAGES/DRESSINGS) ×6 IMPLANT
SPONGE LAP 18X18 X RAY DECT (DISPOSABLE) ×12 IMPLANT
SUT PLAIN 0 NONE (SUTURE) IMPLANT
SUT PLAIN 2 0 (SUTURE) ×2
SUT PLAIN ABS 2-0 CT1 27XMFL (SUTURE) ×1 IMPLANT
SUT VIC AB 0 CT1 36 (SUTURE) ×21 IMPLANT
SUT VIC AB 4-0 KS 27 (SUTURE) ×3 IMPLANT
TOWEL OR 17X24 6PK STRL BLUE (TOWEL DISPOSABLE) ×3 IMPLANT
TRAY FOLEY CATH SILVER 14FR (SET/KITS/TRAYS/PACK) ×3 IMPLANT

## 2016-06-08 NOTE — Plan of Care (Signed)
Problem: Education: Goal: Knowledge of condition will improve Outcome: Progressing Went over some education with mom, interpreter present

## 2016-06-08 NOTE — Transfer of Care (Signed)
Immediate Anesthesia Transfer of Care Note  Patient: Davette Prestegui-Bedolla  Procedure(s) Performed: Procedure(s): CESAREAN SECTION (N/A)  Patient Location: PACU  Anesthesia Type:Epidural  Level of Consciousness: awake, alert  and oriented  Airway & Oxygen Therapy: Patient Spontanous Breathing  Post-op Assessment: Report given to RN and Post -op Vital signs reviewed and stable  Post vital signs: Reviewed and stable  Last Vitals:  Vitals:   06/08/16 1725 06/08/16 1726  BP:    Pulse: (!) 109 (!) 107  Resp: 19 14  Temp:      Last Pain:  Vitals:   06/08/16 1719  TempSrc:   PainSc: 0-No pain         Complications: No apparent anesthesia complications

## 2016-06-08 NOTE — Anesthesia Postprocedure Evaluation (Signed)
Anesthesia Post Note  Patient: Lauren [name]  Procedure(s) Performed: Procedure(s) (LRB): CESAREAN SECTION (N/A)  Patient location during evaluation: Mother Baby Anesthesia Type: Epidural Level of consciousness: awake and alert and oriented Pain management: pain level controlled Vital Signs Assessment: post-procedure vital signs reviewed and stable Respiratory status: spontaneous breathing and nonlabored ventilation Cardiovascular status: stable Postop Assessment: no headache, no backache, patient able to bend at knees, no signs of nausea or vomiting and adequate PO intake Anesthetic complications: no        Last Vitals:  Vitals:   06/08/16 1830 06/08/16 1849  BP: (!) 111/59 102/63  Pulse: (!) 104 97  Resp: 17 18  Temp:  37.9 C    Last Pain:  Vitals:   06/08/16 1849  TempSrc: Oral  PainSc: 0-No pain   Pain Goal:                 Melaine Mcphee

## 2016-06-08 NOTE — Anesthesia Preprocedure Evaluation (Signed)
Anesthesia Evaluation  Patient identified by MRN, date of birth, ID band Patient awake    Reviewed: Allergy & Precautions, NPO status , Patient's Chart, lab work & pertinent test results  History of Anesthesia Complications Negative for: history of anesthetic complications  Airway Mallampati: II  TM Distance: >3 FB Neck ROM: Full    Dental  (+) Dental Advisory Given   Pulmonary neg pulmonary ROS,    breath sounds clear to auscultation       Cardiovascular negative cardio ROS   Rhythm:Regular Rate:Normal     Neuro/Psych  Headaches,    GI/Hepatic negative GI ROS, Neg liver ROS,   Endo/Other  Morbid obesity  Renal/GU negative Renal ROS     Musculoskeletal   Abdominal (+) + obese,   Peds  Hematology plt 392k, takes daily aspirin for increased platelet count, last ASA yesterday am   Anesthesia Other Findings   Reproductive/Obstetrics (+) Pregnancy                             Anesthesia Physical Anesthesia Plan  ASA: II  Anesthesia Plan: Epidural   Post-op Pain Management:    Induction:   Airway Management Planned: Natural Airway  Additional Equipment:   Intra-op Plan:   Post-operative Plan:   Informed Consent: I have reviewed the patients History and Physical, chart, labs and discussed the procedure including the risks, benefits and alternatives for the proposed anesthesia with the patient or authorized representative who has indicated his/her understanding and acceptance.   Dental advisory given  Plan Discussed with:   Anesthesia Plan Comments: (Patient identified. Risks/Benefits/Options discussed with patient including but not limited to bleeding, infection, nerve damage, paralysis, failed block, incomplete pain control, headache, blood pressure changes, nausea, vomiting, reactions to medication both or allergic, itching and postpartum back pain. Confirmed with bedside nurse  the patient's most recent platelet count. Confirmed with patient that they are not currently taking any anticoagulation, have any bleeding history or any family history of bleeding disorders. Patient expressed understanding and wished to proceed. All questions were answered. )        Anesthesia Quick Evaluation

## 2016-06-08 NOTE — Anesthesia Procedure Notes (Signed)
Epidural Patient location during procedure: OB Start time: 06/08/2016 2:49 AM End time: 06/08/2016 3:24 AM  Staffing Anesthesiologist: Annye Asa Performed: anesthesiologist   Preanesthetic Checklist Completed: patient identified, surgical consent, pre-op evaluation, timeout performed, IV checked, risks and benefits discussed and monitors and equipment checked  Epidural Patient position: sitting Prep: site prepped and draped and DuraPrep Patient monitoring: blood pressure, continuous pulse ox and heart rate Approach: midline Location: L3-L4 Injection technique: LOR air  Needle:  Needle type: Tuohy  Needle gauge: 17 G Needle length: 9 cm Needle insertion depth: 5.5 cm Catheter type: closed end flexible Catheter size: 19 Gauge Catheter at skin depth: 11 cm Test dose: negative (1% lidocaine)  Assessment Events: blood not aspirated, injection not painful, no injection resistance, negative IV test and no paresthesia  Additional Notes Pt identified in Labor room.  Monitors applied. Working IV access confirmed. Sterile prep, drape lumbar spine.  1% lido local L 3,4.  #17ga Touhy LOR air at 5.5 cm L 3,4, cath in easily to 11 cm skin. Test dose OK, cath dosed and infusion begun.  Patient asymptomatic, VSS, no heme aspirated, tolerated well.  Jenita Seashore, MDReason for block:procedure for pain

## 2016-06-08 NOTE — Op Note (Signed)
Lauren [name] PROCEDURE DATE: 06/07/2016 - 06/08/2016  PREOPERATIVE DIAGNOSIS: Intrauterine pregnancy at  [redacted]w[redacted]d weeks gestation; non-reassuring fetal status  POSTOPERATIVE DIAGNOSIS: The same  PROCEDURE:     Cesarean Section  SURGEON:  Dr. Mora [name]  ASSISTANT:   INDICATIONS: Lauren [name] is a 41 y.o. [id number] at [redacted]w[redacted]d scheduled for cesarean section secondary to non-reassuring fetal status.  The risks of cesarean section discussed with the patient included but were not limited to: bleeding which may require transfusion or reoperation; infection which may require antibiotics; injury to bowel, bladder, ureters or other surrounding organs; injury to the fetus; need for additional procedures including hysterectomy in the event of a life-threatening hemorrhage; placental abnormalities wth subsequent pregnancies, incisional problems, thromboembolic phenomenon and other postoperative/anesthesia complications. The patient concurred with the proposed plan, giving informed written consent for the procedure.    FINDINGS:  Viable female infant in cephalic presentation.  Apgars 8 and 9. Bloody amniotic fluid.  Intact placenta, three vessel cord.  Several clots delivered before the baby upon uterine incision. Normal uterus, fallopian tubes and ovaries bilaterally.  ANESTHESIA:    Spinal INTRAVENOUS FLUIDS:4500 ml ESTIMATED BLOOD LOSS: 1900 ml URINE OUTPUT:  150 ml SPECIMENS: Placenta sent to pathology COMPLICATIONS: None immediate  PROCEDURE IN DETAIL:  The patient received intravenous antibiotics and had sequential compression devices applied to her lower extremities while in the preoperative area.  She was then taken to the operating room where anesthesia was induced and was found to be adequate. A foley catheter was placed into her bladder and attached to Lauren [name] gravity. She was then placed in a dorsal supine position with a leftward tilt, and prepped and draped in a sterile  manner. After an adequate timeout was performed, a Pfannenstiel skin incision was made with scalpel and carried through to the underlying layer of fascia. The fascia was incised in the midline and this incision was extended bilaterally using the Mayo scissors. Kocher clamps were applied to the superior aspect of the fascial incision and the underlying rectus muscles were dissected off bluntly. A similar process was carried out on the inferior aspect of the facial incision. The rectus muscles were separated in the midline bluntly and the peritoneum was entered bluntly. The Alexis self-retaining retractor was introduced into the abdominal cavity. Attention was turned to the lower uterine segment where a transverse hysterotomy was made with a scalpel and extended bilaterally bluntly. The infant was successfully delivered, and cord was clamped and cut and infant was handed over to awaiting neonatology team. Uterine massage was then administered and the placenta delivered intact with three-vessel cord. The uterus was cleared of clot and debris.  The hysterotomy was closed with 0 Vicryl in a running locked fashion, and an imbricating layer was also placed with a 0 Vicryl. Extension of the uterine incision was noted on the the left side of the incision which was corrected with several figure of eight sutures. Overall, excellent hemostasis was noted. To ensure persistence of hemostasis, Arista was applied over the uterine incision.The pelvis copiously irrigated and cleared of all clot and debris. Hemostasis was confirmed on all surfaces.  The peritoneum and the muscles were reapproximated using 0 vicryl interrupted stitches. The fascia was then closed using 0 Vicryl in a running fashion.  The subcutaneous layer was reapproximated with plain gut and the skin was closed in a subcuticular fashion using 3.0 Vicryl. The patient tolerated the procedure well. Sponge, lap, instrument and needle counts were correct x 2. She was  taken  to the recovery room in stable condition.    Lauren [name] ConstantMD  06/08/2016 4:54 PM

## 2016-06-08 NOTE — Progress Notes (Signed)
Patient seen. Patient has had significant bleeding. She has had fetal tachycardia with late and variable decelerations. Attempted to push with patient. No making significant progress and baby not tolerating pushing. Will proceed to c-section. Interpreter used to discuss risks and benefits.   The risks of cesarean section were discussed with the patient including but were not limited to: bleeding which may require transfusion or reoperation; infection which may require antibiotics; injury to bowel, bladder, ureters or other surrounding organs; injury to the fetus; need for additional procedures including hysterectomy in the event of a life-threatening hemorrhage; placental abnormalities wth subsequent pregnancies, incisional problems, thromboembolic phenomenon and other postoperative/anesthesia complications.  The patient concurred with the proposed plan, giving informed written consent for the procedures.  Patient has been NPO since this AM she will remain NPO for procedure. Anesthesia and OR aware.  Preoperative prophylactic antibiotics and SCDs ordered on call to the OR.  To OR when ready.  Jacquiline Doe, MD  06/08/2016 [phone]

## 2016-06-08 NOTE — Lactation Note (Signed)
This note was copied from a baby's chart. Lactation Consultation Note  Patient Name: Lauren [name] Today's Date: 06/08/2016   Attempted to visit with mom in PACU. Infant awake and alert. Mom with c/s for NRFHR and EBL of 1300 cc. Mom is asleep currently and has been feeling ill. Interpreter not available as with another patient. LC will follow up at a later time.      Maternal Data    Feeding    LATCH Score/Interventions                      Lactation Tools Discussed/Used     Consult Status      Donn Pierini 06/08/2016, 5:31 PM

## 2016-06-08 NOTE — Progress Notes (Signed)
Evaluated patient for progress of labor.  Feeling well with epidural.  FHR baseline 130s, mod variability, pos accels, no decels, contractions q2-52min. No cervical change over past four hours. Started pitocin. Anticipate SVD.

## 2016-06-09 ENCOUNTER — Encounter (HOSPITAL_COMMUNITY): Payer: Self-pay | Admitting: Obstetrics and Gynecology

## 2016-06-09 DIAGNOSIS — O9903 Anemia complicating the puerperium: Secondary | ICD-10-CM

## 2016-06-09 DIAGNOSIS — R112 Nausea with vomiting, unspecified: Secondary | ICD-10-CM

## 2016-06-09 LAB — CBC
HCT: 19 % — ABNORMAL LOW (ref 36.0–46.0)
HEMATOCRIT: 19.8 % — AB (ref 36.0–46.0)
Hemoglobin: 7.1 g/dL — ABNORMAL LOW (ref 12.0–15.0)
Hemoglobin: 6.7 g/dL — CL (ref 12.0–15.0)
MCH: 31.6 pg (ref 26.0–34.0)
MCH: 31 pg (ref 26.0–34.0)
MCHC: 35.9 g/dL (ref 30.0–36.0)
MCHC: 35.3 g/dL (ref 30.0–36.0)
MCV: 88 fL (ref 78.0–100.0)
MCV: 88 fL (ref 78.0–100.0)
PLATELETS: 293 10*3/uL (ref 150–400)
Platelets: 267 10*3/uL (ref 150–400)
RBC: 2.25 MIL/uL — ABNORMAL LOW (ref 3.87–5.11)
RBC: 2.16 MIL/uL — AB (ref 3.87–5.11)
RDW: 15.3 % (ref 11.5–15.5)
RDW: 15.5 % (ref 11.5–15.5)
WBC: 10.9 10*3/uL — ABNORMAL HIGH (ref 4.0–10.5)
WBC: 11.3 10*3/uL — ABNORMAL HIGH (ref 4.0–10.5)

## 2016-06-09 LAB — PREPARE RBC (CROSSMATCH)

## 2016-06-09 MED ORDER — SODIUM CHLORIDE 0.9 % IV SOLN: Freq: Once | INTRAVENOUS | Status: DC

## 2016-06-09 MED ORDER — BISACODYL 10 MG RE SUPP
10.0000 mg | Freq: Once | RECTAL | Status: AC
  Administered 2016-06-09: 10 mg via RECTAL
  Filled 2016-06-09: qty 1

## 2016-06-09 NOTE — Progress Notes (Signed)
Assessed patient and noted her abdomen was distended and very painful upon light touch. Notified Dr. Vanetta Shawl and currently at bedside with patient. Stat CBC ordered and VS every 30 mins until CBC results are back.

## 2016-06-09 NOTE — Progress Notes (Signed)
Attempted to visit with Lauren [name] to provide information about Advance Directives.  Pt was in the restroom and had multiple visitors at the time of visit.  Was able to communicate with her through the door to inform her that I left the information for her to review and to let her nurse know if she'd like to speak with someone to ask questions or to complete the document.  Please page as further needs arise.  Donald Prose. Elyn Peers, M.Div. [facility] Chaplain Pager [phone] Office [phone]

## 2016-06-09 NOTE — Progress Notes (Addendum)
Patient ID: Lauren [name], female   DOB: 27-Dec-1975, 41 y.o.   MRN: [id number]  POSTPARTUM PROGRESS NOTE  Post Op/Partum Day #1 Subjective:  Lauren [name] is a 41 y.o. [id number] [redacted]w[redacted]d s/p PLTCS 2/2 NRFHT, possible abruption.  No acute events overnight.  Pt denies problems with ambulating, voiding or po intake.  She reports nausea or vomiting last night, has not tried a meal today.  Pain is moderately controlled.  She has not had flatus. She has not had bowel movement.  Lochia Minimal. Denies dizziness.   Objective: Blood pressure (!) 112/57, pulse 96, temperature 98.2 F (36.8 C), temperature source Oral, resp. rate 18, height 5\' 3"  (1.6 m), weight 198 lb (89.8 kg), last menstrual period 08/28/2015, SpO2 99 %, unknown if currently breastfeeding.  Physical Exam:  General: alert, cooperative and no distress Lochia:normal flow Chest: CTAB Heart: RRR no m/r/g Abdomen: Great BS all 4 quadrants, soft, nontender, mildly distended Uterine Fundus: firm, below umbilicus, appropriately tender Incision: clean, dry, intact with honeycomb dressing DVT Evaluation: No calf swelling or tenderness Extremities: Trace edema   Recent Labs  06/08/16 1815 06/09/16 0546  HGB 9.2* 7.1*  HCT 27.0* 19.8*    Assessment/Plan:  ASSESSMENT: Lauren [name] is a 41 y.o. [id number] [redacted]w[redacted]d s/p PLTCS  Plan for discharge tomorrow, Breastfeeding and Lactation consult  Will given suppository, advanced diet as tolerated Will repeat Hb tomorrow, due to drop from 9.2 to 7.1 today. No signs of symptomatic anemia, but if below 7, will transfuse.    LOS: 2 days   Katherine Basset, DO [place] [facility], [facility]  06/09/2016, 3:42 PM

## 2016-06-09 NOTE — Progress Notes (Signed)
Patient ID: Lauren [name], female   DOB: 01/20/1976, 41 y.o.   MRN: [id number]  S: Called back to patient's room. Patient decided she would like to have blood transfusion. She requests that it be done when no family members are around. RN was also concerned with abdominal distension and tenderness, which has worsened since this AM. She has received a suppository. She tolerated lunch today. Patient states she is feeling a little more dizzy and tired, has not had flatus still.   O:  Vitals:   06/09/16 0800 06/09/16 1108 06/09/16 1740 06/09/16 1810  BP: (!) 112/57 109/61 (!) 108/48 104/87  Pulse: 96 94 95 (!) 101  Resp: 18 20 18 20   Temp: 98.2 F (36.8 C) 98.3 F (36.8 C) 98.2 F (36.8 C)   TempSrc: Oral Oral Oral   SpO2:      Weight:      Height:       General: NAD CV: Normal rate Resp: Good respiratory effort. Not tachypneic Abd: Distended (more than this morning), soft, tympanic, minimal bowel sounds, mildly tender to palpation with mild guarding without rebound tenderness.  Extr: Trace edema  A/P: Transfuse 2 units PRBC, consent signed and in chart CBC  Monitor vitals NPO

## 2016-06-09 NOTE — Progress Notes (Signed)
UR chart review completed.  

## 2016-06-09 NOTE — Progress Notes (Signed)
Assisted patient to restroom, patient declines lightheadness or dizziness.  Patient complains of abdominal tenderness without passing flatus.  Encourages patient to ambulate in hallway due to NPO status.  Notified Dr. Vanetta Shawl of patient's CBC results and VS's.  Instructed to get one more set of vitals and if stable, may d/c vitals signs every 30 mins.  Will hang blood between 2100-2200 this evening. No new orders received.

## 2016-06-09 NOTE — Lactation Note (Signed)
This note was copied from a baby's chart. Lactation Consultation Note; Initial visit with this mom and baby now 61 hours old. Family member interpreted for me. Mom reports baby just finished nursing for the first time. She is now asleep on mom's chest. Reports no pain with latch. Has been giving bottles of formula. Reviewed importance of frequent nursing to promote good milk supply. Encouraged to always breast feed first then give formula if baby is still hungry. Experienced BF mom who nursed each baby for 3 months but had trouble with milk supply. Spanish BF brochure given. Reviewed our phone number, OP appointments as resources for support after DC. No questions at present. To call for assist prn  Patient Name: Lauren [name] Today's Date: 06/09/2016 Reason for consult: Initial assessment   Maternal Data Formula Feeding for Exclusion: Yes Reason for exclusion: Mother's choice to formula and breast feed on admission Has patient been taught Hand Expression?: Yes Does the patient have breastfeeding experience prior to this delivery?: Yes  Feeding Feeding Type: Breast Fed Nipple Type: Slow - flow Length of feed: 10 min (per Mom)  LATCH Score/Interventions                      Lactation Tools Discussed/Used     Consult Status Consult Status: Follow-up Date: 06/10/16 Follow-up type: In-patient    Truddie Crumble 06/09/2016, 11:21 AM

## 2016-06-09 NOTE — Progress Notes (Addendum)
CSW acknowledged consult and completed chart review.  MOB chart indicates records from [facility].  PNC was initiated on 12/14/15, and MOB had regular appointments.  CSW also meets with therapist for depression a the health department.  Consult will be screened out. Please contact CSW at [facility] request or a need arise.   CSW will monitor infant's cord and will make a rpoert to [place] if needed.     Laurey Arrow, MSW, LCSW Clinical Social Work [phone]

## 2016-06-10 ENCOUNTER — Encounter (HOSPITAL_COMMUNITY): Payer: Self-pay | Admitting: Obstetrics and Gynecology

## 2016-06-10 DIAGNOSIS — Z3A40 40 weeks gestation of pregnancy: Secondary | ICD-10-CM

## 2016-06-10 DIAGNOSIS — O9903 Anemia complicating the puerperium: Secondary | ICD-10-CM

## 2016-06-10 LAB — CBC WITH DIFFERENTIAL/PLATELET
BASOS ABS: 0 10*3/uL (ref 0.0–0.1)
BASOS PCT: 0 %
EOS ABS: 0.3 10*3/uL (ref 0.0–0.7)
Eosinophils Relative: 2 %
HEMATOCRIT: 22.9 % — AB (ref 36.0–46.0)
Hemoglobin: 8.1 g/dL — ABNORMAL LOW (ref 12.0–15.0)
Lymphocytes Relative: 11 %
Lymphs Abs: 1.5 10*3/uL (ref 0.7–4.0)
MCH: 30.6 pg (ref 26.0–34.0)
MCHC: 35.4 g/dL (ref 30.0–36.0)
MCV: 86.4 fL (ref 78.0–100.0)
MONO ABS: 0.3 10*3/uL (ref 0.1–1.0)
Monocytes Relative: 2 %
NEUTROS ABS: 11.7 10*3/uL — AB (ref 1.7–7.7)
Neutrophils Relative %: 85 %
PLATELETS: 288 10*3/uL (ref 150–400)
RBC: 2.65 MIL/uL — ABNORMAL LOW (ref 3.87–5.11)
RDW: 15.8 % — AB (ref 11.5–15.5)
WBC: 13.8 10*3/uL — ABNORMAL HIGH (ref 4.0–10.5)

## 2016-06-10 MED ORDER — OXYCODONE HCL 5 MG PO TABS: 5.0000 mg | ORAL_TABLET | ORAL | 0 refills | Status: DC | PRN

## 2016-06-10 MED ORDER — DOCUSATE SODIUM 250 MG PO CAPS: 250.0000 mg | ORAL_CAPSULE | Freq: Every day | ORAL | 1 refills | Status: DC

## 2016-06-10 MED ORDER — FERROUS FUMARATE 324 (106 FE) MG PO TABS: 1.0000 | ORAL_TABLET | Freq: Two times a day (BID) | ORAL | 1 refills | Status: DC

## 2016-06-10 MED ORDER — IBUPROFEN 600 MG PO TABS: 600.0000 mg | ORAL_TABLET | Freq: Four times a day (QID) | ORAL | 0 refills | Status: DC | PRN

## 2016-06-10 NOTE — Discharge Summary (Signed)
OB Discharge Summary     Patient Name: Lauren [name] DOB: [birth year] MRN: [id number]  Date of admission: 06/07/2016 Delivering MD: Mora Bellman   Date of discharge: 06/10/2016  Admitting diagnosis: 61.6WKS CTX Intrauterine pregnancy: [redacted]w[redacted]d     Secondary diagnosis:  Active Problems:   Pregnancy  Additional problems: AMA     Discharge diagnosis: Term Pregnancy Delivered, Anemia and s/p abruption during labor                                                                                                Post partum procedures:blood transfusion x 2u PRBC  Augmentation: Pitocin  Complications: Placental Abruption and Hemorrhage>1090mL  Hospital course:  Onset of Labor With Unplanned C/S  41 y.o. yo [id number] at [redacted]w[redacted]d was admitted in Latent Labor on 06/07/2016. Patient had a labor course significant for becoming completely dilated w/ significant bleeding, and pushing some w/ continued fetal tachycardia and late/variable decelerations. No significant progress w/ pushing and baby continued not to tolerate so decision made for C/S. Membrane Rupture Time/Date: 9:09 AM ,06/08/2016   The patient went for cesarean section due to Non-Reassuring FHR, and delivered a Viable infant,06/08/2016  Details of operation can be found in separate operative note. Patient had a postpartum course significant for requiring a bld tx due to anemia on POD#1.  On POD#2 she feels well and is ambulating, tolerating a regular diet, passing flatus, and urinating well and requesting an early d/c. Patient is discharged home in stable condition 06/10/16.  Physical exam  Vitals:   06/10/16 0120 06/10/16 0345 06/10/16 0523 06/10/16 1012  BP: 108/64 (!) 109/55 113/64 118/78  Pulse: (!) 109 100 (!) 101 88  Resp: 14 18 18 18   Temp: 98.9 F (37.2 C) 98.4 F (36.9 C) 99 F (37.2 C) 99.1 F (37.3 C)  TempSrc: Oral Oral  Oral  SpO2: 99%     Weight:      Height:       General: alert and cooperative Lochia:  appropriate Uterine Fundus: firm Incision: honeycomb intact with sm unchanged drainage DVT Evaluation: No evidence of DVT seen on physical exam. Labs: initial Hgb 12.6- dropped to nadir of 6.7 PP prior to bld tx Lab Results  Component Value Date   WBC 13.8 (H) 06/10/2016   HGB 8.1 (L) 06/10/2016   HCT 22.9 (L) 06/10/2016   MCV 86.4 06/10/2016   PLT 288 06/10/2016   CMP Latest Ref Rng & Units 10/15/2012  Glucose 70 - 99 mg/dL 89  BUN 6 - 23 mg/dL 17  Creatinine 0.50 - 1.10 mg/dL 0.59  Sodium 135 - 145 mEq/L 137  Potassium 3.5 - 5.1 mEq/L 3.9  Chloride 96 - 112 mEq/L 101  CO2 19 - 32 mEq/L 29  Calcium 8.4 - 10.5 mg/dL 9.3  Total Protein 6.0 - 8.3 g/dL 8.0  Total Bilirubin 0.3 - 1.2 mg/dL 0.2(L)  Alkaline Phos 39 - 117 U/L 177(H)  AST 0 - 37 U/L 39(H)  ALT 0 - 35 U/L 62(H)    Discharge instruction: per After Visit Summary and "Baby and Me Booklet".  After  visit meds:  Allergies as of 06/10/2016   No Known Allergies     Medication List    STOP taking these medications   aspirin EC 81 MG tablet     TAKE these medications   docusate sodium 250 MG capsule Commonly known as:  COLACE Take 1 capsule (250 mg total) by mouth daily.   Ferrous Fumarate 324 (106 Fe) MG Tabs tablet Commonly known as:  HEMOCYTE - 106 mg FE Take 1 tablet (106 mg of iron total) by mouth 2 (two) times daily.   ibuprofen 600 MG tablet Commonly known as:  ADVIL,MOTRIN Take 1 tablet (600 mg total) by mouth every 6 (six) hours as needed.   oxyCODONE 5 MG immediate release tablet Commonly known as:  Oxy IR/ROXICODONE Take 1 tablet (5 mg total) by mouth every 4 (four) hours as needed (pain scale 4-7).   Prenatal Vitamins 0.8 MG tablet Take 1 tablet by mouth daily.       Diet: routine diet  Activity: Advance as tolerated. Pelvic rest for 6 weeks.   Outpatient follow up:4 weeks Follow up Appt:No future appointments. Follow up Visit:No Follow-up on file.  Postpartum contraception: Depo  Provera  Newborn Data: Live born female  Birth Weight: 8 lb 0.4 oz (3640 g) APGAR: 8, 9  Baby Feeding: Bottle and Breast Disposition:home with mother   06/10/2016 Serita Grammes, CNM  12:39 PM

## 2016-06-11 LAB — TYPE AND SCREEN
ABO/RH(D): B POS
ANTIBODY SCREEN: NEGATIVE
UNIT DIVISION: 0
Unit division: 0

## 2016-06-12 ENCOUNTER — Inpatient Hospital Stay (HOSPITAL_COMMUNITY): Admission: RE | Admit: 2016-06-12 | Payer: Self-pay | Source: Ambulatory Visit

## 2016-06-22 ENCOUNTER — Inpatient Hospital Stay (HOSPITAL_COMMUNITY)
Admission: AD | Admit: 2016-06-22 | Discharge: 2016-06-22 | Disposition: A | Discharge status: Home / Self Care | Payer: Self-pay | Source: Ambulatory Visit | Attending: Obstetrics & Gynecology | Admitting: Obstetrics & Gynecology

## 2016-06-22 DIAGNOSIS — N820 Vesicovaginal fistula: Secondary | ICD-10-CM | POA: Insufficient documentation

## 2016-06-22 DIAGNOSIS — O9089 Other complications of the puerperium, not elsewhere classified: Secondary | ICD-10-CM | POA: Insufficient documentation

## 2016-06-22 LAB — URINALYSIS, ROUTINE W REFLEX MICROSCOPIC
Bilirubin Urine: NEGATIVE
GLUCOSE, UA: NEGATIVE mg/dL
KETONES UR: NEGATIVE mg/dL
Nitrite: NEGATIVE
PROTEIN: NEGATIVE mg/dL
Specific Gravity, Urine: 1.017 (ref 1.005–1.030)
pH: 6 (ref 5.0–8.0)

## 2016-06-22 LAB — WET PREP, GENITAL
CLUE CELLS WET PREP: NONE SEEN
Sperm: NONE SEEN
Trich, Wet Prep: NONE SEEN
Yeast Wet Prep HPF POC: NONE SEEN

## 2016-06-22 MED ORDER — CEPHALEXIN 500 MG PO CAPS: 500.0000 mg | ORAL_CAPSULE | Freq: Two times a day (BID) | ORAL | 0 refills | Status: DC

## 2016-06-22 NOTE — MAU Provider Note (Signed)
History     CSN: [id number]  Arrival date and time: 06/22/16 [id number]   First Provider Initiated Contact with Patient 06/22/16 2102    Interpreter present for encounter  Chief Complaint  Patient presents with  . Postpartum Complications  . Vaginal Discharge   Postpartum 14 days here with leaking fluid. Symptoms started shortly after she was discharged home. She reports leaking clear pink tinged fluid. She feels certain fluid is coming from the urethra but does not think it is urine. Lochia has stopped. BMs are normal. No dysuria, polyuria, or urgency. Has not resumed IC.    Past Medical History:  Diagnosis Date  . Abnormal Pap smear   . AMA (advanced maternal age) multigravida 22+   . Headache(784.0)   . Hx of chlamydia infection   . Hx of ectopic pregnancy   . Hx: UTI (urinary tract infection)   . Language barrier   . Late prenatal care   . Thrombocytosis ([place])   . Vaginal Pap smear, abnormal     Past Surgical History:  Procedure Laterality Date  . APPENDECTOMY    . CESAREAN SECTION N/A 06/08/2016   Procedure: CESAREAN SECTION;  Surgeon: Mora Bellman, MD;  Location: [place];  Service: Obstetrics;  Laterality: N/A;  . CHOLECYSTECTOMY N/A 10/19/2012   Procedure: LAPAROSCOPIC CHOLECYSTECTOMY WITH INTRAOPERATIVE CHOLANGIOGRAM;  Surgeon: Shann Medal, MD;  Location: [place];  Service: General;  Laterality: N/A;  . ECTOPIC PREGNANCY SURGERY  6years ago  . SALPINGECTOMY      Family History  Problem Relation Age of Onset  . Seizures Son   . Hypertension Son     Social History  Substance Use Topics  . Smoking status: Never Smoker  . Smokeless tobacco: Never Used  . Alcohol use No    Allergies: No Known Allergies  Prescriptions Prior to Admission  Medication Sig Dispense Refill Last Dose  . docusate sodium (COLACE) 250 MG capsule Take 1 capsule (250 mg total) by mouth daily. 30 capsule 1   . Ferrous Fumarate (HEMOCYTE - 106 MG FE) 324 (106 Fe) MG TABS tablet  Take 1 tablet (106 mg of iron total) by mouth 2 (two) times daily. 60 tablet 1   . ibuprofen (ADVIL,MOTRIN) 600 MG tablet Take 1 tablet (600 mg total) by mouth every 6 (six) hours as needed. 30 tablet 0   . oxyCODONE (OXY IR/ROXICODONE) 5 MG immediate release tablet Take 1 tablet (5 mg total) by mouth every 4 (four) hours as needed (pain scale 4-7). 50 tablet 0   . Prenatal Multivit-Min-Fe-FA (PRENATAL VITAMINS) 0.8 MG tablet Take 1 tablet by mouth daily. 90 tablet 3 06/07/2016 at Unknown time    Review of Systems  Constitutional: Negative for fever.  Gastrointestinal: Negative for abdominal pain.  Genitourinary: Positive for vaginal discharge. Negative for dysuria, frequency, hematuria and urgency.  Musculoskeletal: Positive for back pain (lower).   Physical Exam   Blood pressure 120/66, pulse 93, temperature 98.8 F (37.1 C), temperature source Oral, resp. rate 18, height 5\' 3"  (1.6 m), weight 75.3 kg (166 lb), SpO2 99 %, unknown if currently breastfeeding.  Physical Exam  Nursing note and vitals reviewed. Constitutional: She is oriented to person, place, and time. She appears well-developed and well-nourished. No distress.  HENT:  Head: Normocephalic and atraumatic.  Neck: Normal range of motion. Neck supple.  Cardiovascular: Normal rate.   Respiratory: Effort normal.  GI: Soft. She exhibits no distension. There is no tenderness.  Genitourinary:  Genitourinary Comments: Copious clear  yellow tinged fluid coming from vagina. No urine leakage from urethra noted. Speculum: clear yellow tinged fluid pooling in vault Cervix 1cm, slight bleeding with speculum contact  Musculoskeletal: Normal range of motion.  Neurological: She is alert and oriented to person, place, and time.  Skin: Skin is warm and dry.  Low transverse abd incision, well approximated. No edema, erythema, or drainage.  Psychiatric: She has a normal mood and affect.   Results for orders placed or performed during the  hospital encounter of 06/22/16 (from the past 24 hour(s))  Urinalysis, Routine w reflex microscopic     Status: Abnormal   Collection Time: 06/22/16  7:36 PM  Result Value Ref Range   Color, Urine YELLOW YELLOW   APPearance CLOUDY (A) CLEAR   Specific Gravity, Urine 1.017 1.005 - 1.030   pH 6.0 5.0 - 8.0   Glucose, UA NEGATIVE NEGATIVE mg/dL   Hgb urine dipstick MODERATE (A) NEGATIVE   Bilirubin Urine NEGATIVE NEGATIVE   Ketones, ur NEGATIVE NEGATIVE mg/dL   Protein, ur NEGATIVE NEGATIVE mg/dL   Nitrite NEGATIVE NEGATIVE   Leukocytes, UA LARGE (A) NEGATIVE   RBC / HPF 0-5 0 - 5 RBC/hpf   WBC, UA TOO NUMEROUS TO COUNT 0 - 5 WBC/hpf   Bacteria, UA RARE (A) NONE SEEN   Squamous Epithelial / LPF 0-5 (A) NONE SEEN   Mucous PRESENT   Wet prep, genital     Status: Abnormal   Collection Time: 06/22/16  9:25 PM  Result Value Ref Range   Yeast Wet Prep HPF POC NONE SEEN NONE SEEN   Trich, Wet Prep NONE SEEN NONE SEEN   Clue Cells Wet Prep HPF POC NONE SEEN NONE SEEN   WBC, Wet Prep HPF POC MANY (A) NONE SEEN   Sperm NONE SEEN    MAU Course  Procedures  MDM Labs ordered and reviewed. Presentation, clinical findings, and plan discussed with Dr. Elonda Husky. Will order outpatient retrograde cystogram. Spoke with Dr. Radene Knee in radiology and he states these aren't done by radiology but usually done by Urology. Message sent to clinical pool and K. Rassette to arrange. Stable for discharge home.  Assessment and Plan   1. Vesicovaginal fistula    Discharge home Follow up in [place] after cystogram Rx Keflex  Allergies as of 06/22/2016   No Known Allergies     Medication List    STOP taking these medications   docusate sodium 250 MG capsule Commonly known as:  COLACE     TAKE these medications   cephALEXin 500 MG capsule Commonly known as:  KEFLEX Take 1 capsule (500 mg total) by mouth 2 (two) times daily.   Ferrous Fumarate 324 (106 Fe) MG Tabs tablet Commonly known as:  HEMOCYTE - 106  mg FE Take 1 tablet (106 mg of iron total) by mouth 2 (two) times daily.   ibuprofen 600 MG tablet Commonly known as:  ADVIL,MOTRIN Take 1 tablet (600 mg total) by mouth every 6 (six) hours as needed. What changed:  reasons to take this   oxyCODONE 5 MG immediate release tablet Commonly known as:  Oxy IR/ROXICODONE Take 1 tablet (5 mg total) by mouth every 4 (four) hours as needed (pain scale 4-7).   Prenatal Vitamins 0.8 MG tablet Take 1 tablet by mouth daily.      Julianne Handler, CNM 06/22/2016, 9:08 PM

## 2016-06-23 ENCOUNTER — Telehealth: Payer: Self-pay | Admitting: General Practice

## 2016-06-23 DIAGNOSIS — N820 Vesicovaginal fistula: Secondary | ICD-10-CM

## 2016-06-23 LAB — GC/CHLAMYDIA PROBE AMP (~~LOC~~) NOT AT ARMC
CHLAMYDIA, DNA PROBE: NEGATIVE
NEISSERIA GONORRHEA: NEGATIVE

## 2016-06-23 NOTE — Telephone Encounter (Signed)
Per Threasa Beards, patient needs urgent referral to urology for cystogram. Scheduled with [facility] for 3/2 @ 8:45am. Patient will need to bring $250 with her to appt. Called patient with pacific interpreter [phone] & informed her of appt, address & phone number as well as upfront payment. Patient verbalized understanding to all & had no questions

## 2016-06-24 ENCOUNTER — Other Ambulatory Visit (HOSPITAL_COMMUNITY)
Admission: RE | Admit: 2016-06-24 | Discharge: 2016-06-24 | Disposition: A | Discharge status: Home / Self Care | Payer: Medicaid Other | Source: Ambulatory Visit | Attending: Urology | Admitting: Urology

## 2016-06-24 ENCOUNTER — Encounter (HOSPITAL_COMMUNITY): Payer: Self-pay

## 2016-06-24 ENCOUNTER — Other Ambulatory Visit (HOSPITAL_COMMUNITY): Payer: Self-pay | Admitting: Urology

## 2016-06-24 ENCOUNTER — Encounter (HOSPITAL_COMMUNITY): Payer: Self-pay | Admitting: Emergency Medicine

## 2016-06-24 ENCOUNTER — Ambulatory Visit (HOSPITAL_COMMUNITY)
Admission: RE | Admit: 2016-06-24 | Discharge: 2016-06-24 | Disposition: A | Discharge status: Home / Self Care | Payer: Medicaid Other | Source: Ambulatory Visit | Attending: Urology | Admitting: Urology

## 2016-06-24 ENCOUNTER — Inpatient Hospital Stay (HOSPITAL_COMMUNITY)
Admission: EM | Admit: 2016-06-24 | Discharge: 2016-06-29 | DRG: 781 | Disposition: A | Discharge status: Home / Self Care | Payer: Medicaid Other | Attending: Urology | Admitting: Urology

## 2016-06-24 DIAGNOSIS — O9A23 Injury, poisoning and certain other consequences of external causes complicating the puerperium: Secondary | ICD-10-CM | POA: Diagnosis present

## 2016-06-24 DIAGNOSIS — N2889 Other specified disorders of kidney and ureter: Secondary | ICD-10-CM | POA: Diagnosis present

## 2016-06-24 DIAGNOSIS — N820 Vesicovaginal fistula: Secondary | ICD-10-CM

## 2016-06-24 DIAGNOSIS — S3710XA Unspecified injury of ureter, initial encounter: Secondary | ICD-10-CM | POA: Diagnosis present

## 2016-06-24 DIAGNOSIS — N821 Other female urinary-genital tract fistulae: Secondary | ICD-10-CM | POA: Diagnosis present

## 2016-06-24 DIAGNOSIS — N322 Vesical fistula, not elsewhere classified: Secondary | ICD-10-CM

## 2016-06-24 DIAGNOSIS — Y838 Other surgical procedures as the cause of abnormal reaction of the patient, or of later complication, without mention of misadventure at the time of the procedure: Secondary | ICD-10-CM | POA: Diagnosis present

## 2016-06-24 DIAGNOSIS — N898 Other specified noninflammatory disorders of vagina: Secondary | ICD-10-CM | POA: Diagnosis present

## 2016-06-24 DIAGNOSIS — R52 Pain, unspecified: Secondary | ICD-10-CM

## 2016-06-24 DIAGNOSIS — N1330 Unspecified hydronephrosis: Secondary | ICD-10-CM | POA: Insufficient documentation

## 2016-06-24 DIAGNOSIS — O9989 Other specified diseases and conditions complicating pregnancy, childbirth and the puerperium: Principal | ICD-10-CM | POA: Diagnosis present

## 2016-06-24 DIAGNOSIS — O8629 Other urinary tract infection following delivery: Secondary | ICD-10-CM | POA: Diagnosis present

## 2016-06-24 DIAGNOSIS — Z9049 Acquired absence of other specified parts of digestive tract: Secondary | ICD-10-CM | POA: Insufficient documentation

## 2016-06-24 DIAGNOSIS — N9981 Other intraoperative complications of genitourinary system: Secondary | ICD-10-CM | POA: Diagnosis present

## 2016-06-24 DIAGNOSIS — Y763 Surgical instruments, materials and obstetric and gynecological devices (including sutures) associated with adverse incidents: Secondary | ICD-10-CM | POA: Diagnosis present

## 2016-06-24 LAB — URINE CULTURE

## 2016-06-24 MED ORDER — KETOROLAC TROMETHAMINE 30 MG/ML IJ SOLN
30.0000 mg | Freq: Once | INTRAMUSCULAR | Status: AC
  Administered 2016-06-25: 30 mg via INTRAVENOUS
  Filled 2016-06-24: qty 1

## 2016-06-24 MED ORDER — KETOROLAC TROMETHAMINE 30 MG/ML IJ SOLN: 30.0000 mg | Freq: Once | INTRAMUSCULAR | Status: DC

## 2016-06-24 MED ORDER — IOPAMIDOL (ISOVUE-300) INJECTION 61%
100.0000 mL | Freq: Once | INTRAVENOUS | Status: AC | PRN
  Administered 2016-06-24: 100 mL via INTRAVENOUS

## 2016-06-24 MED ORDER — IOPAMIDOL (ISOVUE-300) INJECTION 61%
INTRAVENOUS | Status: AC
  Filled 2016-06-24: qty 100

## 2016-06-24 NOTE — ED Triage Notes (Signed)
Pt c/o back pain and genitourinary discharge; pt had C-section 15 days ago; pt states she thinks she may have accidentally been cut in her genital area; pt states she has clear discharge from genital area but is unable to specify from where it is coming; C-Section incision is well-approximated with no redness, swelling, or signs of infection

## 2016-06-24 NOTE — ED Provider Notes (Signed)
[place] DEPT Provider Note   CSN: [id number] Arrival date & time: 06/24/16  2113  By signing my name below, I, Oleh Genin, attest that this documentation has been prepared under the direction and in the presence of Alexis Frock, MD. Electronically Signed: Oleh Genin, Scribe. 06/24/16. 11:49 PM.   History   Chief Complaint Chief Complaint  Patient presents with  . Post C-Section  . Back Pain    HPI Lauren [name] is a 41 y.o. female who underwent caesarian delivery on 2/14 presents to the ED for evaluation of copious vaginal discharge of clear yellow fluid and back pain. This patient states that she has experienced clear vaginal discharge without pain since 2/17. Today, she was seen at [facility] where she underwent ureteroscopy and CT abdomen/pelvis; she does not know the results of her imaging studies. She has since experienced L lumber back pain with subjective fevers. At interview, she states that her primary reason for presenting to the ED is her back pain which onset post-procedure. Left back pain and flank pain has become severe. She denies any vomiting. No vaginal bleeding. Reports mild pain over her incision sites; however no other complications with incision.   The history is provided by the patient. No language interpreter was used.    Past Medical History:  Diagnosis Date  . Abnormal Pap smear   . AMA (advanced maternal age) multigravida 9+   . Headache(784.0)   . Hx of chlamydia infection   . Hx of ectopic pregnancy   . Hx: UTI (urinary tract infection)   . Language barrier   . Late prenatal care   . Thrombocytosis ([place])   . Vaginal Pap smear, abnormal     Patient Active Problem List   Diagnosis Date Noted  . Ureteral fistula 06/25/2016  . Pregnancy 06/07/2016  . Gall bladder disease 10/17/2012    Past Surgical History:  Procedure Laterality Date  . APPENDECTOMY    . CESAREAN SECTION N/A 06/08/2016   Procedure: CESAREAN SECTION;  Surgeon: Mora Bellman, MD;  Location: [place];  Service: Obstetrics;  Laterality: N/A;  . CHOLECYSTECTOMY N/A 10/19/2012   Procedure: LAPAROSCOPIC CHOLECYSTECTOMY WITH INTRAOPERATIVE CHOLANGIOGRAM;  Surgeon: Shann Medal, MD;  Location: [place];  Service: General;  Laterality: N/A;  . ECTOPIC PREGNANCY SURGERY  6years ago  . SALPINGECTOMY      OB History    Gravida Para Term Preterm AB Living   6 4 4  0 1 4   SAB TAB Ectopic Multiple Live Births   0 0 1 0 4       Home Medications    Prior to Admission medications   Medication Sig Start Date End Date Taking? Authorizing Provider  cephALEXin (KEFLEX) 500 MG capsule Take 1 capsule (500 mg total) by mouth 2 (two) times daily. 06/22/16 06/29/16 Yes Julianne Handler, CNM  ibuprofen (ADVIL,MOTRIN) 200 MG tablet Take 400 mg by mouth every 6 (six) hours as needed for moderate pain.   Yes Historical Provider, MD  oxyCODONE (OXY IR/ROXICODONE) 5 MG immediate release tablet Take 1 tablet (5 mg total) by mouth every 4 (four) hours as needed (pain scale 4-7). 06/10/16  Yes Myrtis Ser, CNM  Prenatal Multivit-Min-Fe-FA (PRENATAL VITAMINS) 0.8 MG tablet Take 1 tablet by mouth daily. 06/10/13  Yes Allen Norris, MD  Ferrous Fumarate (HEMOCYTE - 106 MG FE) 324 (106 Fe) MG TABS tablet Take 1 tablet (106 mg of iron total) by mouth 2 (two) times daily. Patient not taking:  Reported on 06/24/2016 06/10/16   Myrtis Ser, CNM  ibuprofen (ADVIL,MOTRIN) 600 MG tablet Take 1 tablet (600 mg total) by mouth every 6 (six) hours as needed. Patient not taking: Reported on 06/24/2016 06/10/16   Myrtis Ser, CNM    Family History Family History  Problem Relation Age of Onset  . Seizures Son   . Hypertension Son     Social History Social History  Substance Use Topics  . Smoking status: Never Smoker  . Smokeless tobacco: Never Used  . Alcohol use No     Allergies   Patient has no known allergies.   Review of  Systems Review of Systems 10 systems reviewed and all are negative for acute change except as noted in the HPI.  Physical Exam Updated Vital Signs BP (!) 91/49 (BP Location: Right Arm)   Pulse 64   Temp 98.4 F (36.9 C)   Resp 18   SpO2 96%   Physical Exam  Constitutional: She is oriented to person, place, and time. She appears well-developed and well-nourished.  HENT:  Head: Normocephalic and atraumatic.  Mouth/Throat: Oropharynx is clear and moist.  Eyes: Conjunctivae and EOM are normal.  Cardiovascular: Normal rate and regular rhythm.   2/6 systolic ejection murmur heard over the L sternal boarder.  Pulmonary/Chest: Effort normal and breath sounds normal. She has no wheezes. She has no rales.  Abdominal:  Mild LLQ and RLQ tenderness. No guarding or rebound. No masses. Pfannenstiel incision is well healing.   Musculoskeletal: She exhibits no edema or tenderness.  Tenderness to the L flank  Neurological: She is alert and oriented to person, place, and time. No cranial nerve deficit. She exhibits normal muscle tone. Coordination normal.  Skin: Skin is warm and dry.  Psychiatric: She has a normal mood and affect. Her behavior is normal.     ED Treatments / Results  DIAGNOSTIC STUDIES: Oxygen Saturation is 95 percent on room air by my interpretation.    COORDINATION OF CARE: 11:37 PM Discussed treatment plan with pt at bedside and pt agreed to plan.  Labs (all labs ordered are listed, but only abnormal results are displayed) Labs Reviewed  COMPREHENSIVE METABOLIC PANEL - Abnormal; Notable for the following:       Result Value   Potassium 3.2 (*)    Glucose, Bld 116 (*)    BUN 21 (*)    Creatinine, Ser 1.12 (*)    Albumin 3.3 (*)    Alkaline Phosphatase 195 (*)    All other components within normal limits  CBC WITH DIFFERENTIAL/PLATELET - Abnormal; Notable for the following:    WBC 14.3 (*)    RBC 3.60 (*)    Hemoglobin 10.3 (*)    HCT 31.9 (*)    Platelets 855  (*)    Neutro Abs 10.7 (*)    All other components within normal limits  URINALYSIS, ROUTINE W REFLEX MICROSCOPIC - Abnormal; Notable for the following:    Specific Gravity, Urine >1.046 (*)    Hgb urine dipstick SMALL (*)    Leukocytes, UA MODERATE (*)    Squamous Epithelial / LPF 0-5 (*)    All other components within normal limits  CULTURE, BLOOD (ROUTINE X 2)  CULTURE, BLOOD (ROUTINE X 2)  HIV ANTIBODY (ROUTINE TESTING)  I-STAT CG4 LACTIC ACID, ED  I-STAT CG4 LACTIC ACID, ED    EKG  EKG Interpretation None       Radiology Ct Abdomen Pelvis W Contrast  Result Date: 06/24/2016  CLINICAL DATA:  Vaginal pain and discharge. Two weeks postpartum from C-section delivery. Vesico vaginal fistula. EXAM: CT ABDOMEN AND PELVIS WITH CONTRAST TECHNIQUE: Multidetector CT imaging of the abdomen and pelvis was performed using the standard protocol following bolus administration of intravenous contrast. CONTRAST:  153mL ISOVUE-300 IOPAMIDOL (ISOVUE-300) INJECTION 61% COMPARISON:  None. FINDINGS: Lower Chest: No acute findings. Hepatobiliary: No masses identified. Prior cholecystectomy noted. No evidence of biliary dilatation. Pancreas:  No mass or inflammatory changes. Spleen: Within normal limits in size and appearance. Adrenals/Urinary Tract: No masses identified. Mild left hydroureteronephrosis is seen. A multi loculated fluid collection with rim enhancement is seen in the left lower abdomen and pelvis measuring 5.7 x 4.9 cm. This collection extends along the lateral aspect of the left ureter inferiorly to the vagina, which contains both fluid and air. Enlarged postpartum uterus is seen with a small amount of air also noted within the endometrial cavity. This suspicious for a uretero-vaginal fistula due to left ureteral injury. No evidence of contrast leak or extravasation from the urinary bladder on delayed imaging. Stomach/Bowel: No evidence of obstruction, inflammatory process or abnormal fluid  collections. Vascular/Lymphatic: No pathologically enlarged lymph nodes. No abdominal aortic aneurysm. Reproductive:  No mass identified. Other:  None. Musculoskeletal:  No suspicious bone lesions identified. IMPRESSION: Mild left hydroureteronephrosis. Multiloculated rim enhancing fluid collection in left lower quadrant extends inferiorly to the vagina. This is suspicious for uretero-vaginal fistula due to ureteral injury, with secondary urinoma or abscess. Enlarged postpartum uterus. These results will be called to the ordering clinician or representative by the Radiologist Assistant, and communication documented in the PACS or zVision Dashboard. Electronically Signed   By: Earle Gell M.D.   On: 06/24/2016 16:08    Procedures Procedures (including critical care time)  Medications Ordered in ED Medications  0.9 %  sodium chloride infusion (1,000 mLs Intravenous New Bag/Given 06/25/16 0113)    Followed by  0.9 %  sodium chloride infusion (1,000 mLs Intravenous New Bag/Given 06/25/16 0113)  cefTRIAXone (ROCEPHIN) 1 g in dextrose 5 % 50 mL IVPB (not administered)  dextrose 5 % and 0.45 % NaCl with KCl 20 mEq/L infusion (not administered)  cefTRIAXone (ROCEPHIN) 1 g in dextrose 5 % 50 mL IVPB (not administered)  oxyCODONE (Oxy IR/ROXICODONE) immediate release tablet 5 mg (not administered)  HYDROmorphone (DILAUDID) injection 0.5-1 mg (not administered)  senna-docusate (Senokot-S) tablet 1 tablet (not administered)  acetaminophen (TYLENOL) tablet 1,000 mg (not administered)  ketorolac (TORADOL) 30 MG/ML injection 30 mg (30 mg Intravenous Given 06/25/16 0010)     Initial Impression / Assessment and Plan / ED Course  I have reviewed the triage vital signs and the nursing notes.  Pertinent labs & imaging results that were available during my care of the patient were reviewed by me and considered in my medical decision making (see chart for details).    Consult: Dr. Tresa Moore of urology has seen the  patient emergency department.  Final Clinical Impressions(s) / ED Diagnoses   Final diagnoses:  Ureterovaginal fistula  Patient is postpartum from C-section 2\14. The complication of ureteral injury which is caused fistula producing copious vaginal drainage which the patient has had 3 days postpartum. She now reports she developed severe pain in the left back and flank. CT scan does show urinoma or abscess with suspected ureteral vaginal fistula. Dr. Tresa Moore has evaluated the patient and determine she will need nephrostomy tube and admission.  New Prescriptions New Prescriptions   No medications on file  Charlesetta Shanks, MD 06/25/16 0230

## 2016-06-25 ENCOUNTER — Inpatient Hospital Stay (HOSPITAL_COMMUNITY): Payer: Medicaid Other | Admitting: Certified Registered Nurse Anesthetist

## 2016-06-25 ENCOUNTER — Encounter (HOSPITAL_COMMUNITY): Payer: Self-pay | Admitting: Certified Registered Nurse Anesthetist

## 2016-06-25 ENCOUNTER — Inpatient Hospital Stay (HOSPITAL_COMMUNITY): Payer: Medicaid Other

## 2016-06-25 ENCOUNTER — Encounter (HOSPITAL_COMMUNITY)
Admission: EM | Disposition: A | Discharge status: Home / Self Care | Payer: Self-pay | Source: Home / Self Care | Attending: Urology

## 2016-06-25 DIAGNOSIS — N821 Other female urinary-genital tract fistulae: Secondary | ICD-10-CM | POA: Diagnosis present

## 2016-06-25 DIAGNOSIS — N898 Other specified noninflammatory disorders of vagina: Secondary | ICD-10-CM | POA: Diagnosis not present

## 2016-06-25 DIAGNOSIS — N9981 Other intraoperative complications of genitourinary system: Secondary | ICD-10-CM | POA: Diagnosis not present

## 2016-06-25 DIAGNOSIS — O9989 Other specified diseases and conditions complicating pregnancy, childbirth and the puerperium: Secondary | ICD-10-CM | POA: Diagnosis present

## 2016-06-25 DIAGNOSIS — S3710XA Unspecified injury of ureter, initial encounter: Secondary | ICD-10-CM | POA: Diagnosis not present

## 2016-06-25 DIAGNOSIS — Y763 Surgical instruments, materials and obstetric and gynecological devices (including sutures) associated with adverse incidents: Secondary | ICD-10-CM | POA: Diagnosis present

## 2016-06-25 DIAGNOSIS — Z87442 Personal history of urinary calculi: Secondary | ICD-10-CM

## 2016-06-25 DIAGNOSIS — N2889 Other specified disorders of kidney and ureter: Secondary | ICD-10-CM | POA: Diagnosis present

## 2016-06-25 DIAGNOSIS — O8629 Other urinary tract infection following delivery: Secondary | ICD-10-CM | POA: Diagnosis not present

## 2016-06-25 DIAGNOSIS — Y838 Other surgical procedures as the cause of abnormal reaction of the patient, or of later complication, without mention of misadventure at the time of the procedure: Secondary | ICD-10-CM | POA: Diagnosis present

## 2016-06-25 DIAGNOSIS — O9A23 Injury, poisoning and certain other consequences of external causes complicating the puerperium: Secondary | ICD-10-CM | POA: Diagnosis not present

## 2016-06-25 HISTORY — DX: Personal history of urinary calculi: Z87.442

## 2016-06-25 HISTORY — PX: CYSTOSCOPY W/ URETERAL STENT PLACEMENT: SHX1429

## 2016-06-25 LAB — COMPREHENSIVE METABOLIC PANEL
ALBUMIN: 3.3 g/dL — AB (ref 3.5–5.0)
ALT: 34 U/L (ref 14–54)
ANION GAP: 11 (ref 5–15)
AST: 31 U/L (ref 15–41)
Alkaline Phosphatase: 195 U/L — ABNORMAL HIGH (ref 38–126)
BILIRUBIN TOTAL: 0.5 mg/dL (ref 0.3–1.2)
BUN: 21 mg/dL — AB (ref 6–20)
CO2: 23 mmol/L (ref 22–32)
Calcium: 9.2 mg/dL (ref 8.9–10.3)
Chloride: 104 mmol/L (ref 101–111)
Creatinine, Ser: 1.12 mg/dL — ABNORMAL HIGH (ref 0.44–1.00)
GFR calc Af Amer: >60 mL/min (ref >60)
GFR calc non Af Amer: >60 mL/min (ref >60)
GLUCOSE: 116 mg/dL — AB (ref 65–99)
POTASSIUM: 3.2 mmol/L — AB (ref 3.5–5.1)
SODIUM: 138 mmol/L (ref 135–145)
Total Protein: 7.8 g/dL (ref 6.5–8.1)

## 2016-06-25 LAB — URINALYSIS, ROUTINE W REFLEX MICROSCOPIC
BACTERIA UA: NONE SEEN
BILIRUBIN URINE: NEGATIVE
Glucose, UA: NEGATIVE mg/dL
Ketones, ur: NEGATIVE mg/dL
NITRITE: NEGATIVE
Protein, ur: NEGATIVE mg/dL
Specific Gravity, Urine: >1.046 — ABNORMAL HIGH (ref 1.005–1.030)
pH: 6 (ref 5.0–8.0)

## 2016-06-25 LAB — CBC WITH DIFFERENTIAL/PLATELET
Basophils Absolute: 0.1 10*3/uL (ref 0.0–0.1)
Basophils Relative: 0 %
EOS PCT: 3 %
Eosinophils Absolute: 0.5 10*3/uL (ref 0.0–0.7)
HEMATOCRIT: 31.9 % — AB (ref 36.0–46.0)
Hemoglobin: 10.3 g/dL — ABNORMAL LOW (ref 12.0–15.0)
LYMPHS ABS: 2.2 10*3/uL (ref 0.7–4.0)
LYMPHS PCT: 15 %
MCH: 28.6 pg (ref 26.0–34.0)
MCHC: 32.3 g/dL (ref 30.0–36.0)
MCV: 88.6 fL (ref 78.0–100.0)
MONO ABS: 0.8 10*3/uL (ref 0.1–1.0)
MONOS PCT: 6 %
NEUTROS ABS: 10.7 10*3/uL — AB (ref 1.7–7.7)
Neutrophils Relative %: 76 %
PLATELETS: 855 10*3/uL — AB (ref 150–400)
RBC: 3.6 MIL/uL — ABNORMAL LOW (ref 3.87–5.11)
RDW: 14.8 % (ref 11.5–15.5)
WBC: 14.3 10*3/uL — ABNORMAL HIGH (ref 4.0–10.5)

## 2016-06-25 LAB — I-STAT CG4 LACTIC ACID, ED
LACTIC ACID, VENOUS: 0.77 mmol/L (ref 0.5–1.9)
Lactic Acid, Venous: 1.53 mmol/L (ref 0.5–1.9)

## 2016-06-25 LAB — URINE CULTURE: Culture: NO GROWTH

## 2016-06-25 LAB — HIV ANTIBODY (ROUTINE TESTING W REFLEX): HIV Screen 4th Generation wRfx: NONREACTIVE

## 2016-06-25 SURGERY — CYSTOSCOPY, WITH RETROGRADE PYELOGRAM AND URETERAL STENT INSERTION
Anesthesia: General | Site: Ureter | Laterality: Left

## 2016-06-25 MED ORDER — DEXTROSE 5 % IV SOLN
1.0000 g | Freq: Once | INTRAVENOUS | Status: AC
  Administered 2016-06-25: 1 g via INTRAVENOUS
  Filled 2016-06-25: qty 10

## 2016-06-25 MED ORDER — MEPERIDINE HCL 25 MG/ML IJ SOLN: 6.2500 mg | INTRAMUSCULAR | Status: DC | PRN

## 2016-06-25 MED ORDER — ONDANSETRON HCL 4 MG/2ML IJ SOLN
INTRAMUSCULAR | Status: DC | PRN
Start: 2016-06-25 — End: 2016-06-25
  Administered 2016-06-25: 4 mg via INTRAVENOUS

## 2016-06-25 MED ORDER — ACETAMINOPHEN 500 MG PO TABS
1000.0000 mg | ORAL_TABLET | Freq: Three times a day (TID) | ORAL | Status: AC
  Administered 2016-06-25 – 2016-06-27 (×9): 1000 mg via ORAL
  Filled 2016-06-25 (×9): qty 2

## 2016-06-25 MED ORDER — KCL IN DEXTROSE-NACL 20-5-0.45 MEQ/L-%-% IV SOLN
INTRAVENOUS | Status: DC
  Administered 2016-06-25 – 2016-06-29 (×4): via INTRAVENOUS
  Filled 2016-06-25 (×5): qty 1000

## 2016-06-25 MED ORDER — IOHEXOL 300 MG/ML  SOLN
INTRAMUSCULAR | Status: DC | PRN
  Administered 2016-06-25: 20 mL

## 2016-06-25 MED ORDER — SENNOSIDES-DOCUSATE SODIUM 8.6-50 MG PO TABS
1.0000 | ORAL_TABLET | Freq: Two times a day (BID) | ORAL | Status: DC
  Administered 2016-06-25 – 2016-06-29 (×10): 1 via ORAL
  Filled 2016-06-25 (×10): qty 1

## 2016-06-25 MED ORDER — KCL IN DEXTROSE-NACL 20-5-0.45 MEQ/L-%-% IV SOLN
INTRAVENOUS | Status: DC
  Administered 2016-06-25: 04:00:00 via INTRAVENOUS
  Filled 2016-06-25: qty 1000

## 2016-06-25 MED ORDER — FENTANYL CITRATE (PF) 100 MCG/2ML IJ SOLN
INTRAMUSCULAR | Status: AC
  Filled 2016-06-25: qty 2

## 2016-06-25 MED ORDER — HYDROMORPHONE HCL 1 MG/ML IJ SOLN: 0.2500 mg | INTRAMUSCULAR | Status: DC | PRN

## 2016-06-25 MED ORDER — PROMETHAZINE HCL 25 MG/ML IJ SOLN: 6.2500 mg | INTRAMUSCULAR | Status: DC | PRN

## 2016-06-25 MED ORDER — SODIUM CHLORIDE 0.9 % IR SOLN
Status: DC | PRN
  Administered 2016-06-25: 3000 mL

## 2016-06-25 MED ORDER — PROPOFOL 10 MG/ML IV BOLUS
INTRAVENOUS | Status: AC
  Filled 2016-06-25: qty 20

## 2016-06-25 MED ORDER — SODIUM CHLORIDE 0.9 % IV SOLN
1000.0000 mL | Freq: Once | INTRAVENOUS | Status: AC
  Administered 2016-06-25: 1000 mL via INTRAVENOUS

## 2016-06-25 MED ORDER — LIDOCAINE 2% (20 MG/ML) 5 ML SYRINGE
INTRAMUSCULAR | Status: DC | PRN
  Administered 2016-06-25: 60 mg via INTRAVENOUS

## 2016-06-25 MED ORDER — MIDAZOLAM HCL 5 MG/5ML IJ SOLN
INTRAMUSCULAR | Status: DC | PRN
  Administered 2016-06-25: 2 mg via INTRAVENOUS

## 2016-06-25 MED ORDER — PROPOFOL 10 MG/ML IV BOLUS
INTRAVENOUS | Status: DC | PRN
  Administered 2016-06-25: 160 mg via INTRAVENOUS

## 2016-06-25 MED ORDER — OXYCODONE HCL 5 MG PO TABS
5.0000 mg | ORAL_TABLET | ORAL | Status: DC | PRN
  Administered 2016-06-28 – 2016-06-29 (×2): 5 mg via ORAL
  Filled 2016-06-25 (×2): qty 1

## 2016-06-25 MED ORDER — 0.9 % SODIUM CHLORIDE (POUR BTL) OPTIME
TOPICAL | Status: DC | PRN
  Administered 2016-06-25: 1000 mL

## 2016-06-25 MED ORDER — HYDROMORPHONE HCL 1 MG/ML IJ SOLN: 0.5000 mg | INTRAMUSCULAR | Status: DC | PRN

## 2016-06-25 MED ORDER — LACTATED RINGERS IV SOLN
INTRAVENOUS | Status: DC | PRN
  Administered 2016-06-25: 08:00:00 via INTRAVENOUS

## 2016-06-25 MED ORDER — DEXTROSE 5 % IV SOLN
1.0000 g | Freq: Every day | INTRAVENOUS | Status: DC
  Administered 2016-06-25: 1 g via INTRAVENOUS
  Filled 2016-06-25: qty 10

## 2016-06-25 MED ORDER — FENTANYL CITRATE (PF) 100 MCG/2ML IJ SOLN
INTRAMUSCULAR | Status: DC | PRN
  Administered 2016-06-25: 25 ug via INTRAVENOUS

## 2016-06-25 MED ORDER — SODIUM CHLORIDE 0.9 % IV SOLN
1000.0000 mL | INTRAVENOUS | Status: DC
  Administered 2016-06-25: 1000 mL via INTRAVENOUS

## 2016-06-25 MED ORDER — MIDAZOLAM HCL 2 MG/2ML IJ SOLN
INTRAMUSCULAR | Status: AC
  Filled 2016-06-25: qty 2

## 2016-06-25 SURGICAL SUPPLY — 16 items
BAG URINE DRAINAGE (UROLOGICAL SUPPLIES) ×3 IMPLANT
BAG URO CATCHER STRL LF (MISCELLANEOUS) ×3 IMPLANT
BASKET ZERO TIP NITINOL 2.4FR (BASKET) IMPLANT
CATH FOLEY 2WAY SLVR  5CC 16FR (CATHETERS) ×2
CATH FOLEY 2WAY SLVR 5CC 16FR (CATHETERS) ×1 IMPLANT
CATH INTERMIT  6FR 70CM (CATHETERS) ×3 IMPLANT
CLOTH BEACON ORANGE TIMEOUT ST (SAFETY) ×3 IMPLANT
GLOVE BIOGEL M STRL SZ7.5 (GLOVE) ×3 IMPLANT
GOWN STRL REUS W/TWL LRG LVL3 (GOWN DISPOSABLE) ×6 IMPLANT
GUIDEWIRE ANG ZIPWIRE 038X150 (WIRE) ×3 IMPLANT
GUIDEWIRE STR DUAL SENSOR (WIRE) ×3 IMPLANT
MANIFOLD NEPTUNE II (INSTRUMENTS) ×3 IMPLANT
PACK CYSTO (CUSTOM PROCEDURE TRAY) ×3 IMPLANT
STENT URET 6FRX24 CONTOUR (STENTS) ×3 IMPLANT
TUBING CONNECTING 10 (TUBING) ×2 IMPLANT
TUBING CONNECTING 10' (TUBING) ×1

## 2016-06-25 NOTE — Anesthesia Procedure Notes (Signed)
Procedure Name: LMA Insertion Performed by: Gean Maidens Pre-anesthesia Checklist: Emergency Drugs available, Patient identified, Suction available, Patient being monitored and Timeout performed Patient Re-evaluated:Patient Re-evaluated prior to inductionOxygen Delivery Method: Circle system utilized Preoxygenation: Pre-oxygenation with 100% oxygen Intubation Type: IV induction Ventilation: Mask ventilation without difficulty LMA: LMA inserted LMA Size: 4.0 Number of attempts: 1 Placement Confirmation: CO2 detector and breath sounds checked- equal and bilateral Tube secured with: Tape Dental Injury: Teeth and Oropharynx as per pre-operative assessment

## 2016-06-25 NOTE — Transfer of Care (Signed)
Immediate Anesthesia Transfer of Care Note  Patient: Lauren [name]  Procedure(s) Performed: Procedure(s): CYSTOSCOPY WITH RETROGRADE PYELOGRAM/LEFT URETERAL STENT PLACEMENT (Left)  Patient Location: PACU  Anesthesia Type:General  Level of Consciousness: sedated, patient cooperative and responds to stimulation  Airway & Oxygen Therapy: Patient Spontanous Breathing and Patient connected to face mask oxygen  Post-op Assessment: Report given to RN and Post -op Vital signs reviewed and stable  Post vital signs: Reviewed and stable  Last Vitals:  Vitals:   06/25/16 0054 06/25/16 0312  BP: (!) 91/49 118/67  Pulse: 64 (!) 59  Resp: 18 16  Temp:  36.6 C    Last Pain:  Vitals:   06/25/16 0415  TempSrc:   PainSc: 0-No pain         Complications: No apparent anesthesia complications

## 2016-06-25 NOTE — Anesthesia Preprocedure Evaluation (Signed)
Anesthesia Evaluation  Patient identified by MRN, date of birth, ID band Patient awake    Reviewed: Allergy & Precautions, NPO status , Patient's Chart, lab work & pertinent test results  History of Anesthesia Complications Negative for: history of anesthetic complications  Airway Mallampati: II  TM Distance: >3 FB Neck ROM: Full    Dental  (+) Dental Advisory Given   Pulmonary neg pulmonary ROS,    breath sounds clear to auscultation       Cardiovascular negative cardio ROS   Rhythm:Regular Rate:Normal     Neuro/Psych  Headaches,    GI/Hepatic negative GI ROS, Neg liver ROS,   Endo/Other  Morbid obesity  Renal/GU negative Renal ROS     Musculoskeletal   Abdominal (+) + obese,   Peds  Hematology plt 392k, takes daily aspirin for increased platelet count, last ASA yesterday am   Anesthesia Other Findings   Reproductive/Obstetrics (+) Pregnancy                             Anesthesia Physical  Anesthesia Plan  ASA: II  Anesthesia Plan: General   Post-op Pain Management:    Induction: Intravenous  Airway Management Planned: LMA  Additional Equipment:   Intra-op Plan:   Post-operative Plan: Extubation in OR  Informed Consent: I have reviewed the patients History and Physical, chart, labs and discussed the procedure including the risks, benefits and alternatives for the proposed anesthesia with the patient or authorized representative who has indicated his/her understanding and acceptance.   Dental advisory given  Plan Discussed with:   Anesthesia Plan Comments:       Anesthesia Quick Evaluation

## 2016-06-25 NOTE — Progress Notes (Signed)
Pt signed consent for procedure by Tresa Moore MD with help from interpreter.

## 2016-06-25 NOTE — ED Notes (Signed)
[facility] at bedside ready for Spanish interpreting

## 2016-06-25 NOTE — H&P (Signed)
Lauren [name] is an 41 y.o. female.    Chief Complaint: Left Ureterovaginal Fistula / Flank Pain  HPI:    1 - Left Ureterovaginal Fistula / Flank Pain / Urinoma - G4P4 s/p cesarean on 06/08/16 by P. Constant MD with copious uriniferous vaginal discharge since, now worsening left flank pain with malaise. CT with left periureteral urinoma around distal ureter, hydro, and delayed nephrogram highly suspicious for obstruction + fistula but no frank contrast extrav seen to denote location.   PMH sig for lap chole, cesarean x1. She denies CV disease / blood thinners / baseline medications.   Today "Lauren [name]" is seen for urgent evaluation for above. No fevers. UCX 2/28 many species, UCX 3/2 obtained and pending.   Past Medical History:  Diagnosis Date  . Abnormal Pap smear   . AMA (advanced maternal age) multigravida 40+   . Headache(784.0)   . Hx of chlamydia infection   . Hx of ectopic pregnancy   . Hx: UTI (urinary tract infection)   . Language barrier   . Late prenatal care   . Thrombocytosis ([address])   . Vaginal Pap smear, abnormal     Past Surgical History:  Procedure Laterality Date  . APPENDECTOMY    . CESAREAN SECTION N/A 06/08/2016   Procedure: CESAREAN SECTION;  Surgeon: Mora Bellman, MD;  Location: [facility];  Service: Obstetrics;  Laterality: N/A;  . CHOLECYSTECTOMY N/A 10/19/2012   Procedure: LAPAROSCOPIC CHOLECYSTECTOMY WITH INTRAOPERATIVE CHOLANGIOGRAM;  Surgeon: Shann Medal, MD;  Location: [facility];  Service: General;  Laterality: N/A;  . ECTOPIC PREGNANCY SURGERY  6years ago  . SALPINGECTOMY      Family History  Problem Relation Age of Onset  . Seizures Son   . Hypertension Son    Social History:  reports that she has never smoked. She has never used smokeless tobacco. She reports that she does not drink alcohol or use drugs.  Allergies: No Known Allergies   (Not in a hospital admission)  Results for orders placed or performed during the  hospital encounter of 06/24/16 (from the past 48 hour(s))  Comprehensive metabolic panel     Status: Abnormal   Collection Time: 06/24/16 11:54 PM  Result Value Ref Range   Sodium 138 135 - 145 mmol/L   Potassium 3.2 (L) 3.5 - 5.1 mmol/L   Chloride 104 101 - 111 mmol/L   CO2 23 22 - 32 mmol/L   Glucose, Bld 116 (H) 65 - 99 mg/dL   BUN 21 (H) 6 - 20 mg/dL   Creatinine, Ser 1.12 (H) 0.44 - 1.00 mg/dL   Calcium 9.2 8.9 - 10.3 mg/dL   Total Protein 7.8 6.5 - 8.1 g/dL   Albumin 3.3 (L) 3.5 - 5.0 g/dL   AST 31 15 - 41 U/L   ALT 34 14 - 54 U/L   Alkaline Phosphatase 195 (H) 38 - 126 U/L   Total Bilirubin 0.5 0.3 - 1.2 mg/dL   GFR calc non Af Amer >60 >60 mL/min   GFR calc Af Amer >60 >60 mL/min    Comment: (NOTE) The eGFR has been calculated using the CKD EPI equation. This calculation has not been validated in all clinical situations. eGFR's persistently <60 mL/min signify possible Chronic Kidney Disease.    Anion gap 11 5 - 15  CBC with Differential     Status: Abnormal   Collection Time: 06/24/16 11:54 PM  Result Value Ref Range   WBC 14.3 (H) 4.0 - 10.5 K/uL  RBC 3.60 (L) 3.87 - 5.11 MIL/uL   Hemoglobin 10.3 (L) 12.0 - 15.0 g/dL   HCT 31.9 (L) 36.0 - 46.0 %   MCV 88.6 78.0 - 100.0 fL   MCH 28.6 26.0 - 34.0 pg   MCHC 32.3 30.0 - 36.0 g/dL   RDW 14.8 11.5 - 15.5 %   Platelets 855 (H) 150 - 400 K/uL   Neutrophils Relative % 76 %   Neutro Abs 10.7 (H) 1.7 - 7.7 K/uL   Lymphocytes Relative 15 %   Lymphs Abs 2.2 0.7 - 4.0 K/uL   Monocytes Relative 6 %   Monocytes Absolute 0.8 0.1 - 1.0 K/uL   Eosinophils Relative 3 %   Eosinophils Absolute 0.5 0.0 - 0.7 K/uL   Basophils Relative 0 %   Basophils Absolute 0.1 0.0 - 0.1 K/uL  Urinalysis, Routine w reflex microscopic     Status: Abnormal   Collection Time: 06/24/16 11:55 PM  Result Value Ref Range   Color, Urine YELLOW YELLOW   APPearance CLEAR CLEAR   Specific Gravity, Urine >1.046 (H) 1.005 - 1.030   pH 6.0 5.0 - 8.0    Glucose, UA NEGATIVE NEGATIVE mg/dL   Hgb urine dipstick SMALL (A) NEGATIVE   Bilirubin Urine NEGATIVE NEGATIVE   Ketones, ur NEGATIVE NEGATIVE mg/dL   Protein, ur NEGATIVE NEGATIVE mg/dL   Nitrite NEGATIVE NEGATIVE   Leukocytes, UA MODERATE (A) NEGATIVE   RBC / HPF 6-30 0 - 5 RBC/hpf   WBC, UA TOO NUMEROUS TO COUNT 0 - 5 WBC/hpf   Bacteria, UA NONE SEEN NONE SEEN   Squamous Epithelial / LPF 0-5 (A) NONE SEEN  I-Stat CG4 Lactic Acid, ED     Status: None   Collection Time: 06/25/16 12:09 AM  Result Value Ref Range   Lactic Acid, Venous 1.53 0.5 - 1.9 mmol/L   Ct Abdomen Pelvis W Contrast  Result Date: 06/24/2016 CLINICAL DATA:  Vaginal pain and discharge. Two weeks postpartum from C-section delivery. Vesico vaginal fistula. EXAM: CT ABDOMEN AND PELVIS WITH CONTRAST TECHNIQUE: Multidetector CT imaging of the abdomen and pelvis was performed using the standard protocol following bolus administration of intravenous contrast. CONTRAST:  137m ISOVUE-300 IOPAMIDOL (ISOVUE-300) INJECTION 61% COMPARISON:  None. FINDINGS: Lower Chest: No acute findings. Hepatobiliary: No masses identified. Prior cholecystectomy noted. No evidence of biliary dilatation. Pancreas:  No mass or inflammatory changes. Spleen: Within normal limits in size and appearance. Adrenals/Urinary Tract: No masses identified. Mild left hydroureteronephrosis is seen. A multi loculated fluid collection with rim enhancement is seen in the left lower abdomen and pelvis measuring 5.7 x 4.9 cm. This collection extends along the lateral aspect of the left ureter inferiorly to the vagina, which contains both fluid and air. Enlarged postpartum uterus is seen with a small amount of air also noted within the endometrial cavity. This suspicious for a uretero-vaginal fistula due to left ureteral injury. No evidence of contrast leak or extravasation from the urinary bladder on delayed imaging. Stomach/Bowel: No evidence of obstruction, inflammatory  process or abnormal fluid collections. Vascular/Lymphatic: No pathologically enlarged lymph nodes. No abdominal aortic aneurysm. Reproductive:  No mass identified. Other:  None. Musculoskeletal:  No suspicious bone lesions identified. IMPRESSION: Mild left hydroureteronephrosis. Multiloculated rim enhancing fluid collection in left lower quadrant extends inferiorly to the vagina. This is suspicious for uretero-vaginal fistula due to ureteral injury, with secondary urinoma or abscess. Enlarged postpartum uterus. These results will be called to the ordering clinician or representative by the Radiologist Assistant, and  communication documented in the PACS or zVision Dashboard. Electronically Signed   By: Earle Gell M.D.   On: 06/24/2016 16:08    Review of Systems  Constitutional: Positive for weight loss. Negative for fever.  HENT: Negative.   Eyes: Negative.   Respiratory: Negative.   Cardiovascular: Negative.   Gastrointestinal: Negative.  Negative for nausea and vomiting.  Genitourinary: Positive for hematuria.  Musculoskeletal: Negative.   Skin: Negative.   Neurological: Negative.   Endo/Heme/Allergies: Negative.   Psychiatric/Behavioral: Negative.     Blood pressure (!) 91/49, pulse 64, temperature 98.4 F (36.9 C), resp. rate 18, SpO2 96 %, unknown if currently breastfeeding. Physical Exam  Constitutional: She is oriented to person, place, and time. She appears well-developed.  Spanish speaking only, interpreter used for interview.   HENT:  Head: Normocephalic.  Eyes: Pupils are equal, round, and reactive to light.  Cardiovascular: Normal rate.   Respiratory: Effort normal.  GI: Soft.  Recent pfannenstiel scar that is dry.   Genitourinary:  Genitourinary Comments: Mild left CVAT. Pooling urine in vagina.   Musculoskeletal: Normal range of motion.  Neurological: She is alert and oriented to person, place, and time.  Skin: Skin is warm.  Psychiatric: She has a normal mood and  affect. Her behavior is normal. Thought content normal.     Assessment/Plan   1 - Left Ureterovaginal Fistula / Flank Pain / Urinoma - discussed likely injury and short term goals of urinary drainge / characterize injury location, allow 6-12 weeks healing with urine diverted away, then subsequent repair pending location of injury. Rec admit, NPO and attempt cysto / left retrograde / left ureteral stent placement in AM with goals of characterizing location / extent of injury and hopefully stenting across. Discussed that highly probable stenting will not be possible and she will require nephrostomy.  Risks, benefits, alternatives, expected peri-op course discussed.   Admit, NPO, MIVF, UCX, empiric rocephin as some bacteruria, consent for OR.   Alexis Frock, MD 06/25/2016, 2:15 AM

## 2016-06-25 NOTE — Brief Op Note (Signed)
06/24/2016 - 06/25/2016  8:12 AM  PATIENT:  Rene Prestegui-Bedolla  41 y.o. female  PRE-OPERATIVE DIAGNOSIS:  possible ureteral injury  POST-OPERATIVE DIAGNOSIS:  left ureteral injury  PROCEDURE:  Procedure(s): CYSTOSCOPY WITH RETROGRADE PYELOGRAM/LEFT URETERAL STENT PLACEMENT (Left)  SURGEON:  Surgeon(s) and Role:    * Alexis Frock, MD - Primary  PHYSICIAN ASSISTANT:   ASSISTANTS: none   ANESTHESIA:   general  EBL:  No intake/output data recorded.  BLOOD ADMINISTERED:none  DRAINS: 1 - 22 F foley to gravity   LOCAL MEDICATIONS USED:  NONE  SPECIMEN:  No Specimen  DISPOSITION OF SPECIMEN:  N/A  COUNTS:  YES  TOURNIQUET:  * No tourniquets in log *  DICTATION: .Other Dictation: Dictation Number [id number]  PLAN OF CARE: Admit to inpatient   PATIENT DISPOSITION:  PACU - hemodynamically stable.   Delay start of Pharmacological VTE agent (>24hrs) due to surgical blood loss or risk of bleeding: yes

## 2016-06-26 LAB — GENTAMICIN LEVEL, RANDOM: Gentamicin Rm: 4.4 ug/mL

## 2016-06-26 MED ORDER — GENTAMICIN SULFATE 40 MG/ML IJ SOLN
7.0000 mg/kg | INTRAMUSCULAR | Status: DC
  Administered 2016-06-26 – 2016-06-29 (×4): 430 mg via INTRAVENOUS
  Filled 2016-06-26 (×5): qty 10.75

## 2016-06-26 MED ORDER — CLINDAMYCIN PHOSPHATE 900 MG/50ML IV SOLN
900.0000 mg | Freq: Three times a day (TID) | INTRAVENOUS | Status: DC
  Administered 2016-06-26 – 2016-06-29 (×11): 900 mg via INTRAVENOUS
  Filled 2016-06-26 (×11): qty 50

## 2016-06-26 NOTE — Op Note (Signed)
NAMEPIERRETTE, [name] NO.:  [id number]  MEDICAL RECORD NO.:  [id number]  LOCATION:  [id number]                         FACILITY:  [facility]  PHYSICIAN:  Alexis Frock, MD     DATE OF BIRTH:  Jan 14, 1976  DATE OF PROCEDURE:  06/25/2016                              OPERATIVE REPORT   PREOPERATIVE DIAGNOSIS:  Suspect left ureterovaginal fistula.  POSTOPERATIVE DIAGNOSIS:  Suspect left ureterovaginal fistula.  PROCEDURE: 1. Cystoscopy and left retrograde pyelogram interpretation. 2. Insertion of left ureteral stent, 6 x 24 Contour, no tether. 3. Exam under anesthesia.  ESTIMATED BLOOD LOSS:  Nil.  COMPLICATION:  None.  SPECIMEN:  None.  FINDINGS: 1. Induration of left apical bladder with purulent discharge     suspicious for site of small fistula and abscess. 2. Short-segment high-grade left ureteral stricture and likely     fistula.  This are estimated to be approximately 3 cm proximal to     ureterovesical junction by retrograde pyelography. 3. Successful cannulation across area of stricture and fistula with     placement of ureteral stent, proximal and renal pelvis, and distal     in urinary bladder.  INDICATIONS:  Lauren [name] is a pleasant 41 year old Hispanic woman with a recent history of complicated cesarean section delivery of her fourth child.  She has been having copious watery and now purulent vaginal discharge since that time.  This is highly suspicious for possible GU fistula.  She had CT scan yesterday with contrast, which revealed significant left hydronephrosis down to a complex fluid collection that did appear to fistulize to the patient's vagina. Options were discussed for management including percutaneous nephrostomy versus attempt of retrograde stenting with the latter being preferred to initial step to both help characterize injury and possibly treat and she wished to proceed.  Informed consent was obtained and placed in the medical  record.  PROCEDURE IN DETAIL:  The patient being, Lauren [name], was confirmed.  Procedure being cysto, left retrograde, left ureteral stent placement, exam under anesthesia was confirmed.  Procedure time-out was performed.  Intravenous antibiotics were administered.  General LMA anesthesia introduced.  The patient was placed into a low lithotomy position.  Sterile field was created by prepping and draping the patient's vagina, introitus, and proximal thighs using iodine.  Exam under anesthesia revealed a copious purulent discharge from the area of vagina.  This was palpated and there was significant area of induration at the left apical area just lateral to the cervix, which appeared to be the site of discharge and likely fistula.  Cystoscopy was then performed using a rigid cystoscope.  Ureteral orifices were singleton bilaterally. Spot fluoroscopic images revealed delayed ureterogram on the left with a contrast extending to an area that appeared to be approximately 3 cm proximal ureterovesical junction.  This was definitely below the level of the iliacs.  There was some small defatting of the area just superior medial to the ureteral orifice likely consistent with inadvertent stitch placement.  The left ureteral orifice was cannulated with a 6-French end- hole catheter and left retrograde pyelogram obtained.  This revealed complete blind ending of the distal ureter without contrast continuity to the area of more proximal ureter and estimated gap approximately 3-4 cm.  Initially, a  0.038 Zip wire was used to try to cannulate beyond this into the area of the ureter.  This was not successful.  Attempt with an angled tip Glidewire was then performed. Using multiple angulations, we successfully cannulated across the area of blind-ending stricture into the more proximal area of ureter up to the level of the renal pelvis.  The open-ended catheter was advanced to the level of the mid  ureter.  Additional retrograde pyelography revealed this was indeed intraluminal in the left ureter and collecting system. As we cannulated across the area of stricture and fistula, felt that interval stenting would likely be warranted optimistically allowing healing over the stent.  As such, the open-ended catheter was exchanged for a new 6 x 24 Contour-type stent over the Zip wire.  Good proximal distal deployment was noted.  Efflux of urine and contrast was seen around into the distal end of the stent.  Then, procedure was terminated.  The patient tolerated procedure well.  There were no immediate periprocedural complications.  The patient was taken to the postanesthesia care unit in stable condition after Foley catheter was placed per urethra to straight drain.          ______________________________ Alexis Frock, MD     TM/MEDQ  D:  06/25/2016  T:  06/26/2016  Job:  [id number]

## 2016-06-26 NOTE — Progress Notes (Signed)
Pharmacy Antibiotic Note  Lauren [name] is a 41 y.o. female admitted on 06/24/2016 with pelvic abscess wtih uretero-vaginal fistula. G4P4 s/p cesarean on 06/08/16. Pharmacy has been consulted for gentamicin dosing.  Plan: -Gentamicin 430 (7mg /kg/IBW) IV q24h -Obtain gent level in 10 hours and adjust according -Follow renal function, cultures and clinical course -clinda (MD) 900mg  IV q8h  Height: 5\' 3"  (160 cm) Weight: 168 lb 6.9 oz (76.4 kg) IBW/kg (Calculated) : 52.4  Temp (24hrs), Avg:98.3 F (36.8 C), Min:97.6 F (36.4 C), Max:99.3 F (37.4 C)   Recent Labs Lab 06/24/16 2354 06/25/16 0009 06/25/16 0254  WBC 14.3*  --   --   CREATININE 1.12*  --   --   LATICACIDVEN  --  1.53 0.77    Estimated Creatinine Clearance: 66.7 mL/min (by C-G formula based on SCr of 1.12 mg/dL (H)).    No Known Allergies  Antimicrobials this admission: 3/3 ceftriaxone x 1  3/4 clinda >> 3/4 gentamicin >> Dose adjustments this admission:   Microbiology results: 3/3 BCx: sent 3/3 abscess fluid :  Thank you for allowing pharmacy to be a part of this patient's care.  Dolly Rias RPh 06/26/2016, 8:03 AM Pager [phone]

## 2016-06-26 NOTE — Anesthesia Postprocedure Evaluation (Signed)
Anesthesia Post Note  Patient: Lauren [name]  Procedure(s) Performed: Procedure(s) (LRB): CYSTOSCOPY WITH RETROGRADE PYELOGRAM/LEFT URETERAL STENT PLACEMENT (Left)  Patient location during evaluation: PACU Anesthesia Type: General Level of consciousness: sedated and patient cooperative Pain management: pain level controlled Vital Signs Assessment: post-procedure vital signs reviewed and stable Respiratory status: spontaneous breathing Cardiovascular status: stable Anesthetic complications: no       Last Vitals:  Vitals:   06/25/16 1833 06/25/16 2128  BP: 112/73 111/65  Pulse: 67 66  Resp: 17 18  Temp: 37.4 C 37.2 C    Last Pain:  Vitals:   06/25/16 2328  TempSrc:   PainSc: [place]

## 2016-06-26 NOTE — Progress Notes (Signed)
1 Day Post-Op  Subjective:  1 - Left Ureterovaginal Fistula  - G4P4 s/p cesarean on 06/08/16 by P. Constant MD with copious uriniferous vaginal discharge since, and uretero-vaginal fistula by CT and cysto / retrogrades 06/25/16. Left JJ stent (6x24 contour) placed across injury 06/25/16.   2 - Pelvic Abscess - foul vaginal drainage from likely pelvic abscess that originated as urinoma from ureteral injury. UCX 3/2 negative. Abscess fluid gram stain + CX 3/4 pending. On Rocephin initially peri-procedure, changing to [place] 3/4 awaiting CX data.   Today "Lauren [name]" is stable. Still some vaginal discharge that is very foul, but decrased quantitiy. No fevers.   Objective: Vital signs in last 24 hours: Temp:  [97.6 F (36.4 C)-99.3 F (37.4 C)] 98.4 F (36.9 C) (03/04 0623) Pulse Rate:  [50-69] 67 (03/04 0623) Resp:  [12-18] 18 (03/04 0623) BP: (99-121)/(55-80) 121/80 (03/04 0623) SpO2:  [97 %-100 %] 97 % (03/04 0623) Last BM Date: 06/23/16  Intake/Output from previous day: 03/03 0701 - 03/04 0700 In: 2007.5 [P.O.:120; I.V.:1837.5; IV Piggyback:50] Out: 2385 [Urine:2385] Intake/Output this shift: No intake/output data recorded.  General appearance: alert, cooperative, appears stated age and Lauren [name] as chaparone, Lauren [name] spanish interpreter as well.  Eyes: negative Nose: Nares normal. Septum midline. Mucosa normal. No drainage or sinus tenderness. Throat: lips, mucosa, and tongue normal; teeth and gums normal Neck: supple, symmetrical, trachea midline Back: symmetric, no curvature. ROM normal. No CVA tenderness. Resp: non-labored on room air.  Cardio: Nl rate Pelvic: foul purlulent vaginal discharge. This was swabbed for gram stain and CX. Clear urine in foley. Vaginal fluid does NOT appear uriniferous.  Extremities: extremities normal, atraumatic, no cyanosis or edema Pulses: 2+ and symmetric Skin: Skin color, texture, turgor normal. No rashes or lesions Lymph nodes: Cervical,  supraclavicular, and axillary nodes normal. Neurologic: Grossly normal  Lab Results:   Recent Labs  06/24/16 2354  WBC 14.3*  HGB 10.3*  HCT 31.9*  PLT 855*   BMET  Recent Labs  06/24/16 2354  NA 138  K 3.2*  CL 104  CO2 23  GLUCOSE 116*  BUN 21*  CREATININE 1.12*  CALCIUM 9.2   PT/INR No results for input(s): LABPROT, INR in the last 72 hours. ABG No results for input(s): PHART, HCO3 in the last 72 hours.  Invalid input(s): PCO2, PO2  Studies/Results: Ct Abdomen Pelvis W Contrast  Result Date: 06/24/2016 CLINICAL DATA:  Vaginal pain and discharge. Two weeks postpartum from C-section delivery. Vesico vaginal fistula. EXAM: CT ABDOMEN AND PELVIS WITH CONTRAST TECHNIQUE: Multidetector CT imaging of the abdomen and pelvis was performed using the standard protocol following bolus administration of intravenous contrast. CONTRAST:  14mL ISOVUE-300 IOPAMIDOL (ISOVUE-300) INJECTION 61% COMPARISON:  None. FINDINGS: Lower Chest: No acute findings. Hepatobiliary: No masses identified. Prior cholecystectomy noted. No evidence of biliary dilatation. Pancreas:  No mass or inflammatory changes. Spleen: Within normal limits in size and appearance. Adrenals/Urinary Tract: No masses identified. Mild left hydroureteronephrosis is seen. A multi loculated fluid collection with rim enhancement is seen in the left lower abdomen and pelvis measuring 5.7 x 4.9 cm. This collection extends along the lateral aspect of the left ureter inferiorly to the vagina, which contains both fluid and air. Enlarged postpartum uterus is seen with a small amount of air also noted within the endometrial cavity. This suspicious for a uretero-vaginal fistula due to left ureteral injury. No evidence of contrast leak or extravasation from the urinary bladder on delayed imaging. Stomach/Bowel: No evidence of obstruction,  inflammatory process or abnormal fluid collections. Vascular/Lymphatic: No pathologically enlarged lymph  nodes. No abdominal aortic aneurysm. Reproductive:  No mass identified. Other:  None. Musculoskeletal:  No suspicious bone lesions identified. IMPRESSION: Mild left hydroureteronephrosis. Multiloculated rim enhancing fluid collection in left lower quadrant extends inferiorly to the vagina. This is suspicious for uretero-vaginal fistula due to ureteral injury, with secondary urinoma or abscess. Enlarged postpartum uterus. These results will be called to the ordering clinician or representative by the Radiologist Assistant, and communication documented in the PACS or zVision Dashboard. Electronically Signed   By: Earle Gell M.D.   On: 06/24/2016 16:08   Dg Retrograde Pyelogram  Result Date: 06/25/2016 CLINICAL DATA:  Retrograde examination for left ureterovaginal fistula. Left flank pain EXAM: RETROGRADE PYELOGRAM COMPARISON:  CT 06/24/2016 FINDINGS: The initial image demonstrates of cystogram. There is contrast in the urinary bladder and there appears to be some filling of a mildly dilated distal left ureter. Second image demonstrates cannulation of the left ureter. There is contrast in the right lower pelvic region and this may not represent the bladder. It is possible that this represents vaginal tissue. There is a small amount of contrast extravasation near the left ureter orifice. This may be related to the left ureter injury. A left ureter stent was placed. There is mild-to-moderate dilatation of the left renal calices. On the final images, there continues to be contrast in the right lower pelvic region which again may represent vaginal tissue. IMPRESSION: Moderate left hydronephrosis and placement of left ureter stent. Focus of contrast extravasation near the left ureter orifice may be related to a ureter injury. In addition, there appears to be contrast in the vaginal region and this would be compatible with history of ureterovaginal fistula. Electronically Signed   By: Markus Daft M.D.   On: 06/25/2016  09:50    Anti-infectives: Anti-infectives    Start     Dose/Rate Route Frequency Ordered Stop   06/25/16 2200  cefTRIAXone (ROCEPHIN) 1 g in dextrose 5 % 50 mL IVPB     1 g 100 mL/hr over 30 Minutes Intravenous Daily at bedtime 06/25/16 0228     06/25/16 0130  cefTRIAXone (ROCEPHIN) 1 g in dextrose 5 % 50 mL IVPB     1 g 100 mL/hr over 30 Minutes Intravenous  Once 06/25/16 0118 06/25/16 0313      Assessment/Plan:  1 - Left Ureterovaginal Fistula  - now stented across. Discussed with pt at best 50% chance of healing over stent. Rec repeat endoscopic eval with ureteroscopy in about 8 weeks to verify ureteral integrity, which if in question, will be best served by ureteral reimplant in elective delayed setting after clearance of infecitous parameters / pelvic inflammation.   2 - Pelvic Abscess - likely from urinoma / fistula. CX obtained today of fluid. Start [place] per pharmacy.  Remain in house, may require re-imaging if vag fluid not improving.   [facility], Mikela Senn 06/26/2016

## 2016-06-27 ENCOUNTER — Encounter (HOSPITAL_COMMUNITY): Payer: Self-pay | Admitting: Urology

## 2016-06-27 LAB — CBC WITH DIFFERENTIAL/PLATELET
BASOS ABS: 0.1 10*3/uL (ref 0.0–0.1)
BASOS PCT: 1 %
EOS ABS: 0.6 10*3/uL (ref 0.0–0.7)
Eosinophils Relative: 8 %
HCT: 31.4 % — ABNORMAL LOW (ref 36.0–46.0)
Hemoglobin: 9.9 g/dL — ABNORMAL LOW (ref 12.0–15.0)
LYMPHS ABS: 1.6 10*3/uL (ref 0.7–4.0)
Lymphocytes Relative: 20 %
MCH: 28.1 pg (ref 26.0–34.0)
MCHC: 31.5 g/dL (ref 30.0–36.0)
MCV: 89.2 fL (ref 78.0–100.0)
Monocytes Absolute: 0.4 10*3/uL (ref 0.1–1.0)
Monocytes Relative: 5 %
Neutro Abs: 5.3 10*3/uL (ref 1.7–7.7)
Neutrophils Relative %: 66 %
PLATELETS: 877 10*3/uL — AB (ref 150–400)
RBC: 3.52 MIL/uL — AB (ref 3.87–5.11)
RDW: 14.9 % (ref 11.5–15.5)
WBC: 8 10*3/uL (ref 4.0–10.5)

## 2016-06-27 LAB — BASIC METABOLIC PANEL
Anion gap: 11 (ref 5–15)
BUN: 13 mg/dL (ref 6–20)
CO2: 25 mmol/L (ref 22–32)
CREATININE: 0.82 mg/dL (ref 0.44–1.00)
Calcium: 9.1 mg/dL (ref 8.9–10.3)
Chloride: 106 mmol/L (ref 101–111)
GFR calc Af Amer: >60 mL/min (ref >60)
Glucose, Bld: 116 mg/dL — ABNORMAL HIGH (ref 65–99)
POTASSIUM: 3.5 mmol/L (ref 3.5–5.1)
SODIUM: 142 mmol/L (ref 135–145)

## 2016-06-27 NOTE — Progress Notes (Signed)
2 Days Post-Op  Subjective:  1 - Left Ureterovaginal Fistula  - G4P4 s/p cesarean on 06/08/16 by P. Constant MD with copious uriniferous vaginal discharge since, and uretero-vaginal fistula by CT and cysto / retrogrades 06/25/16. Left JJ stent (6x24 contour) placed across injury 06/25/16.   2 - Pelvic Abscess - foul vaginal drainage from likely pelvic abscess that originated as urinoma from ureteral injury. UCX 3/2 negative. Abscess fluid gram stain + CX 3/4 pending. On Rocephin initially peri-procedure, changing to [place] 3/4 awaiting CX data.   Today "Lauren [name]" is without complaints. She reports subjective decrease in vaginal discharge. No high grade fevers.   Objective: Vital signs in last 24 hours: Temp:  [98.3 F (36.8 C)-99 F (37.2 C)] 98.3 F (36.8 C) (03/05 0523) Pulse Rate:  [61-75] 64 (03/05 0523) Resp:  [18-19] 18 (03/05 0523) BP: (113-117)/(56-68) 117/64 (03/05 0523) SpO2:  [99 %-100 %] 99 % (03/05 0523) Last BM Date: 06/23/16  Intake/Output from previous day: 03/04 0701 - 03/05 0700 In: 890 [P.O.:240; I.V.:600; IV Piggyback:50] Out: 3135 [Urine:3135] Intake/Output this shift: No intake/output data recorded.  General appearance: alert, cooperative, appears stated age and RN as chaparone and [employer] spanish interpreter also used Eyes: negative Nose: Nares normal. Septum midline. Mucosa normal. No drainage or sinus tenderness. Throat: lips, mucosa, and tongue normal; teeth and gums normal Neck: supple, symmetrical, trachea midline Back: symmetric, no curvature. ROM normal. No CVA tenderness. Resp: non-labored on room air.  Cardio: Nl rate GI: soft, non-tender; bowel sounds normal; no masses,  no organomegaly Pelvic: decreased but persistant foul vaginal discharge.  Extremities: extremities normal, atraumatic, no cyanosis or edema Skin: Skin color, texture, turgor normal. No rashes or lesions Lymph nodes: Cervical, supraclavicular, and axillary nodes  normal. Neurologic: Grossly normal  Lab Results:   Recent Labs  06/24/16 2354 06/27/16 0532  WBC 14.3* 8.0  HGB 10.3* 9.9*  HCT 31.9* 31.4*  PLT 855* 877*   BMET  Recent Labs  06/24/16 2354 06/27/16 0532  NA 138 142  K 3.2* 3.5  CL 104 106  CO2 23 25  GLUCOSE 116* 116*  BUN 21* 13  CREATININE 1.12* 0.82  CALCIUM 9.2 9.1   PT/INR No results for input(s): LABPROT, INR in the last 72 hours. ABG No results for input(s): PHART, HCO3 in the last 72 hours.  Invalid input(s): PCO2, PO2  Studies/Results: Dg Retrograde Pyelogram  Result Date: 06/25/2016 CLINICAL DATA:  Retrograde examination for left ureterovaginal fistula. Left flank pain EXAM: RETROGRADE PYELOGRAM COMPARISON:  CT 06/24/2016 FINDINGS: The initial image demonstrates of cystogram. There is contrast in the urinary bladder and there appears to be some filling of a mildly dilated distal left ureter. Second image demonstrates cannulation of the left ureter. There is contrast in the right lower pelvic region and this may not represent the bladder. It is possible that this represents vaginal tissue. There is a small amount of contrast extravasation near the left ureter orifice. This may be related to the left ureter injury. A left ureter stent was placed. There is mild-to-moderate dilatation of the left renal calices. On the final images, there continues to be contrast in the right lower pelvic region which again may represent vaginal tissue. IMPRESSION: Moderate left hydronephrosis and placement of left ureter stent. Focus of contrast extravasation near the left ureter orifice may be related to a ureter injury. In addition, there appears to be contrast in the vaginal region and this would be compatible with history of ureterovaginal fistula.  Electronically Signed   By: Markus Daft M.D.   On: 06/25/2016 09:50    Anti-infectives: Anti-infectives    Start     Dose/Rate Route Frequency Ordered Stop   06/26/16 0830  gentamicin  (GARAMYCIN) 430 mg in dextrose 5 % 100 mL IVPB     7 mg/kg  62 kg (Adjusted) 110.8 mL/hr over 60 Minutes Intravenous Every 24 hours 06/26/16 0750     06/26/16 0800  clindamycin (CLEOCIN) IVPB 900 mg     900 mg 100 mL/hr over 30 Minutes Intravenous Every 8 hours 06/26/16 0742     06/25/16 2200  cefTRIAXone (ROCEPHIN) 1 g in dextrose 5 % 50 mL IVPB  Status:  Discontinued     1 g 100 mL/hr over 30 Minutes Intravenous Daily at bedtime 06/25/16 0228 06/26/16 0742   06/25/16 0130  cefTRIAXone (ROCEPHIN) 1 g in dextrose 5 % 50 mL IVPB     1 g 100 mL/hr over 30 Minutes Intravenous  Once 06/25/16 0118 06/25/16 0313      Assessment/Plan:  1 - Left Ureterovaginal Fistula  - now stented across. Pt at best 50% chance of healing over stent. Rec repeat endoscopic eval with ureteroscopy in about 8 weeks to verify ureteral integrity, which if in question, will be best served by ureteral reimplant in elective delayed setting after clearance of infecitous parameters / pelvic inflammation.   2 - Pelvic Abscess - likely from urinoma / fistula. Continue Gent + Clinda awaiting further C/S data which I strongly feel needs to guide continued outpatient therapy before discharge.   Remain in house. Goals for DC discussed and she has good understanding.    [facility], Elyza Whitt 06/27/2016

## 2016-06-27 NOTE — Progress Notes (Signed)
Pharmacy Antibiotic Note  Lauren [name] is a 41 y.o. female admitted on 06/24/2016 with pelvic abscess wtih uretero-vaginal fistula.  Pharmacy has been consulted for gentamicin dosing.  Plan: Continue gentamicin 430mg  iv q24hr--level within q24hr dosing.   Will recheck level at least weekly.  Height: 5\' 3"  (160 cm) Weight: 168 lb 6.9 oz (76.4 kg) IBW/kg (Calculated) : 52.4  Temp (24hrs), Avg:98.6 F (37 C), Min:98.3 F (36.8 C), Max:99 F (37.2 C)   Recent Labs Lab 06/24/16 2354 06/25/16 0009 06/25/16 0254 06/26/16 1945  WBC 14.3*  --   --   --   CREATININE 1.12*  --   --   --   LATICACIDVEN  --  1.53 0.77  --   GENTRANDOM  --   --   --  4.4    Estimated Creatinine Clearance: 66.7 mL/min (by C-G formula based on SCr of 1.12 mg/dL (H)).    No Known Allergies  Antimicrobials this admission: 3/3 ceftriaxone x 1  3/4 clinda >> 3/4 gentamicin >> Dose adjustments this admission: None--3/4 Gentamicin level within q24hr dosing  Microbiology results: 3/3 BCx: sent 3/3 abscess fluid :  Thank you for allowing pharmacy to be a part of this patient's care.  Nani Skillern Crowford 06/27/2016 3:59 AM

## 2016-06-28 LAB — CREATININE, SERUM
Creatinine, Ser: 0.91 mg/dL (ref 0.44–1.00)
GFR calc Af Amer: >60 mL/min (ref >60)
GFR calc non Af Amer: >60 mL/min (ref >60)

## 2016-06-28 NOTE — Progress Notes (Signed)
3 Days Post-Op  Subjective:  1 - Left Ureterovaginal Fistula  - G4P4 s/p cesarean on 06/08/16 by P. Constant MD with copious uriniferous vaginal discharge since, and uretero-vaginal fistula by CT and cysto / retrogrades 06/25/16. Left JJ stent (6x24 contour) placed across injury 06/25/16.   2 - Pelvic Abscess - foul vaginal drainage from likely pelvic abscess that originated as urinoma from ureteral injury. UCX 3/2 negative. Abscess fluid gram stain + CX 3/4 pending. On Rocephin initially peri-procedure, changing to [place] 3/4 awaiting CX data.   Today "Lauren [name]" is stable. Anxious to get home to her young children. Abscess CX still pending.   Objective: Vital signs in last 24 hours: Temp:  [98.2 F (36.8 C)-99 F (37.2 C)] 98.2 F (36.8 C) (03/06 1350) Pulse Rate:  [67-82] 82 (03/06 1350) Resp:  [18-20] 20 (03/06 1350) BP: (111-122)/(63-73) 111/68 (03/06 1350) SpO2:  [97 %-100 %] 97 % (03/06 1350) Last BM Date: 06/23/16  Intake/Output from previous day: 03/05 0701 - 03/06 0700 In: 1700.8 [P.O.:240; I.V.:1200; IV Piggyback:260.8] Out: 2575 [Urine:2575] Intake/Output this shift: No intake/output data recorded.  General appearance: alert, cooperative and appears stated age Eyes: negative Nose: Nares normal. Septum midline. Mucosa normal. No drainage or sinus tenderness. Throat: lips, mucosa, and tongue normal; teeth and gums normal Neck: supple, symmetrical, trachea midline Back: symmetric, no curvature. ROM normal. No CVA tenderness. Resp: non-labored on room air.  Cardio: Nl rate GI: soft, non-tender; bowel sounds normal; no masses,  no organomegaly Pelvic: external genitalia normal and persistant but decreased foul vaginal discharge.  Extremities: extremities normal, atraumatic, no cyanosis or edema Pulses: 2+ and symmetric Lymph nodes: Cervical, supraclavicular, and axillary nodes normal. Neurologic: Grossly normal  Lab Results:   Recent Labs  06/27/16 0532  WBC  8.0  HGB 9.9*  HCT 31.4*  PLT 877*   BMET  Recent Labs  06/27/16 0532 06/28/16 0636  NA 142  --   K 3.5  --   CL 106  --   CO2 25  --   GLUCOSE 116*  --   BUN 13  --   CREATININE 0.82 0.91  CALCIUM 9.1  --    PT/INR No results for input(s): LABPROT, INR in the last 72 hours. ABG No results for input(s): PHART, HCO3 in the last 72 hours.  Invalid input(s): PCO2, PO2  Studies/Results: No results found.  Anti-infectives: Anti-infectives    Start     Dose/Rate Route Frequency Ordered Stop   06/26/16 0830  gentamicin (GARAMYCIN) 430 mg in dextrose 5 % 100 mL IVPB     7 mg/kg  62 kg (Adjusted) 110.8 mL/hr over 60 Minutes Intravenous Every 24 hours 06/26/16 0750     06/26/16 0800  clindamycin (CLEOCIN) IVPB 900 mg     900 mg 100 mL/hr over 30 Minutes Intravenous Every 8 hours 06/26/16 0742     06/25/16 2200  cefTRIAXone (ROCEPHIN) 1 g in dextrose 5 % 50 mL IVPB  Status:  Discontinued     1 g 100 mL/hr over 30 Minutes Intravenous Daily at bedtime 06/25/16 0228 06/26/16 0742   06/25/16 0130  cefTRIAXone (ROCEPHIN) 1 g in dextrose 5 % 50 mL IVPB     1 g 100 mL/hr over 30 Minutes Intravenous  Once 06/25/16 0118 06/25/16 0313      Assessment/Plan:  1 - Left Ureterovaginal Fistula  - now stented across. Pt at best 50% chance of healing over stent. Rec repeat endoscopic eval with ureteroscopy in about  8 weeks to verify ureteral integrity, which if in question, will be best served by ureteral reimplant in elective delayed setting after clearance of infecitous parameters / pelvic inflammation.   2 - Pelvic Abscess - likely from urinoma / fistula. Continue Gent + Clinda awaiting further C/S data which I strongly feel needs to guide continued outpatient therapy before discharge.   Remain in house.     [facility], Georgia Baria 06/28/2016

## 2016-06-29 ENCOUNTER — Ambulatory Visit: Payer: Self-pay | Admitting: Obstetrics and Gynecology

## 2016-06-29 LAB — CREATININE, SERUM
CREATININE: 0.92 mg/dL (ref 0.44–1.00)
GFR calc Af Amer: >60 mL/min (ref >60)

## 2016-06-29 MED ORDER — SULFAMETHOXAZOLE-TRIMETHOPRIM 800-160 MG PO TABS: 1.0000 | ORAL_TABLET | Freq: Two times a day (BID) | ORAL | 0 refills | Status: AC

## 2016-06-29 NOTE — Discharge Instructions (Signed)
1 - You may have urinary urgency (bladder spasms) and bloody urine on / off with stent in place. This is normal. ° °2 - Call MD or go to ER for fever >102, severe pain / nausea / vomiting not relieved by medications, or acute change in medical status ° °

## 2016-06-29 NOTE — Discharge Summary (Signed)
Physician Discharge Summary  Patient ID: Lauren [name] MRN: [id number] DOB/AGE: 39-02-1976 39 y.o.  Admit date: 06/24/2016 Discharge date: 06/29/2016  Admission Diagnoses: Ureteralvaginal Fistula, Pelvic Abscess  Discharge Diagnoses:  Active Problems:   Ureteral fistula Pelvic Abscess  Discharged Condition: fair  Hospital Course:   1 - Left Ureterovaginal Fistula - G4P4 s/p cesarean on 06/08/16 by P. Constant MD with copious uriniferous vaginal discharge since, and uretero-vaginal fistula by CT and cysto / retrogrades 06/25/16 upon admission through ER. Left JJ stent (6x24 contour) placed across injury 06/25/16.   2 - Pelvic Abscess - foul vaginal drainage from likely pelvic abscess that originated as urinoma from ureteral injury. UCX 3/2 negative. Abscess fluid gram stain + CX 3/4 pending. On Rocephin initially peri-procedure, changing to [place] 3/4 awaiting CX data. At time of discahrge vaginal discharge nearly resolved, afebtrile x 48 hours. Her CX will aparantly require at least 2 more days for C+S to be back but given clinical improvement pt felt adequate for discharge PM 06/29/16.  Consults: pharmace  Significant Diagnostic Studies: labs: as per above  Treatments: surgery, IV ABX as per above.   Discharge Exam: Blood pressure 112/66, pulse 93, temperature 98.1 F (36.7 C), temperature source Oral, resp. rate 18, height 5\' 3"  (1.6 m), weight 76.4 kg (168 lb 6.9 oz), SpO2 96 %, unknown if currently breastfeeding. General appearance: alert, cooperative, appears stated age and examined with RN and [employer] interpreter Eyes: negative Nose: Nares normal. Septum midline. Mucosa normal. No drainage or sinus tenderness. Throat: lips, mucosa, and tongue normal; teeth and gums normal Neck: supple, symmetrical, trachea midline Back: symmetric, no curvature. ROM normal. No CVA tenderness. Resp: non-labored on room air.  Cardio: Nl rate Pelvic: external genitalia normal and  significatnly decreased foul vaginal drainage, no longer uriniferous.  Extremities: extremities normal, atraumatic, no cyanosis or edema Skin: Skin color, texture, turgor normal. No rashes or lesions Lymph nodes: Cervical, supraclavicular, and axillary nodes normal. Neurologic: Grossly normal  Disposition: 01-Home or Self Care   Allergies as of 06/29/2016   No Known Allergies     Medication List    STOP taking these medications   cephALEXin 500 MG capsule Commonly known as:  KEFLEX   oxyCODONE 5 MG immediate release tablet Commonly known as:  Oxy IR/ROXICODONE   Prenatal Vitamins 0.8 MG tablet     TAKE these medications   Ferrous Fumarate 324 (106 Fe) MG Tabs tablet Commonly known as:  HEMOCYTE - 106 mg FE Take 1 tablet (106 mg of iron total) by mouth 2 (two) times daily.   ibuprofen 200 MG tablet Commonly known as:  ADVIL,MOTRIN Take 400 mg by mouth every 6 (six) hours as needed for moderate pain.   ibuprofen 600 MG tablet Commonly known as:  ADVIL,MOTRIN Take 1 tablet (600 mg total) by mouth every 6 (six) hours as needed.   sulfamethoxazole-trimethoprim 800-160 MG tablet Commonly known as:  BACTRIM DS,SEPTRA DS Take 1 tablet by mouth 2 (two) times daily.        SignedAlexis Frock 06/29/2016, 5:39 PM

## 2016-06-29 NOTE — Progress Notes (Signed)
Pharmacy Antibiotic Note  Lauren [name] is a 41 y.o. female admitted on 06/24/2016 with pelvic abscess wtih uretero-vaginal fistula. G4P4 s/p cesarean on 06/08/16. Pharmacy has been consulted for gentamicin dosing.  Today, 06/29/2016  Day #4 antibiotics  SCr stable  Good UOP  WBC WNL on last check (3/5)  Afebrile  Cultures unrevealing to date, abscess culture still pending  Last random gentamicin level checked 3/4  Plan: -Continue Gentamicin 430 (7mg /kg/IBW) IV q24h -Plan to recheck gentamicin random level today if to continue of gentamicin -Follow renal function, cultures and clinical course -Hopefully can narrow antibiotics soon to minimize exposure to nephrotoxic effects of aminoglycosides (patient without allergies, zosyn would provide similar spectrum of activity) -clinda (MD) 900mg  IV q8h   Height: 5\' 3"  (160 cm) Weight: 168 lb 6.9 oz (76.4 kg) IBW/kg (Calculated) : 52.4  Temp (24hrs), Avg:98.4 F (36.9 C), Min:98.2 F (36.8 C), Max:98.6 F (37 C)   Recent Labs Lab 06/24/16 2354 06/25/16 0009 06/25/16 0254 06/26/16 1945 06/27/16 0532 06/28/16 0636 06/29/16 0544  WBC 14.3*  --   --   --  8.0  --   --   CREATININE 1.12*  --   --   --  0.82 0.91 0.92  LATICACIDVEN  --  1.53 0.77  --   --   --   --   GENTRANDOM  --   --   --  4.4  --   --   --     Estimated Creatinine Clearance: 81.2 mL/min (by C-G formula based on SCr of 0.92 mg/dL).    No Known Allergies  Antimicrobials this admission: 3/3 ceftriaxone x 1  3/4 clinda >> 3/4 gentamicin >>  Dose adjustments this admission: 3/4 Gent level 4.4 at 9.5hrs-within q24hr dosing--no changes   Microbiology results: 3/2 UCx: NG 3/3 BCx: NGTD 3/4 abscess fluid : too young to read (GS = abundant GNR, GPC clusters, GPC pairs)  Thank you for allowing pharmacy to be a part of this patient's care.  Doreene Eland, PharmD, BCPS.   Pager: [phone] 06/29/2016 7:32 AM

## 2016-06-29 NOTE — Progress Notes (Signed)
Foley discontinued, per MD no need to wait for paitnet to urinate before leaving hospital. Discharge instructions reviewed via [employer] interpreter Shanon Brow [phone]. All questions answered.

## 2016-06-30 LAB — CULTURE, BLOOD (ROUTINE X 2)
CULTURE: NO GROWTH
Culture: NO GROWTH

## 2016-07-01 LAB — AEROBIC CULTURE W GRAM STAIN (SUPERFICIAL SPECIMEN)

## 2016-07-01 LAB — AEROBIC CULTURE  (SUPERFICIAL SPECIMEN)

## 2016-09-22 ENCOUNTER — Emergency Department (HOSPITAL_COMMUNITY): Payer: Self-pay

## 2016-09-22 ENCOUNTER — Emergency Department (HOSPITAL_COMMUNITY)
Admission: EM | Admit: 2016-09-22 | Discharge: 2016-09-23 | Disposition: A | Discharge status: Home / Self Care | Payer: Self-pay | Attending: Emergency Medicine | Admitting: Emergency Medicine

## 2016-09-22 ENCOUNTER — Encounter (HOSPITAL_COMMUNITY): Payer: Self-pay

## 2016-09-22 DIAGNOSIS — R945 Abnormal results of liver function studies: Secondary | ICD-10-CM | POA: Insufficient documentation

## 2016-09-22 DIAGNOSIS — R1032 Left lower quadrant pain: Secondary | ICD-10-CM

## 2016-09-22 DIAGNOSIS — D649 Anemia, unspecified: Secondary | ICD-10-CM | POA: Insufficient documentation

## 2016-09-22 DIAGNOSIS — R7989 Other specified abnormal findings of blood chemistry: Secondary | ICD-10-CM

## 2016-09-22 DIAGNOSIS — Z79899 Other long term (current) drug therapy: Secondary | ICD-10-CM | POA: Insufficient documentation

## 2016-09-22 DIAGNOSIS — D75839 Thrombocytosis, unspecified: Secondary | ICD-10-CM

## 2016-09-22 DIAGNOSIS — D473 Essential (hemorrhagic) thrombocythemia: Secondary | ICD-10-CM | POA: Insufficient documentation

## 2016-09-22 LAB — COMPREHENSIVE METABOLIC PANEL
ALBUMIN: 4.2 g/dL (ref 3.5–5.0)
ALK PHOS: 283 U/L — AB (ref 38–126)
ALT: 92 U/L — ABNORMAL HIGH (ref 14–54)
ANION GAP: 7 (ref 5–15)
AST: 51 U/L — ABNORMAL HIGH (ref 15–41)
BILIRUBIN TOTAL: 0.3 mg/dL (ref 0.3–1.2)
BUN: 21 mg/dL — ABNORMAL HIGH (ref 6–20)
CALCIUM: 9.2 mg/dL (ref 8.9–10.3)
CO2: 26 mmol/L (ref 22–32)
Chloride: 108 mmol/L (ref 101–111)
Creatinine, Ser: 0.85 mg/dL (ref 0.44–1.00)
GLUCOSE: 101 mg/dL — AB (ref 65–99)
POTASSIUM: 3.4 mmol/L — AB (ref 3.5–5.1)
Sodium: 141 mmol/L (ref 135–145)
TOTAL PROTEIN: 7.9 g/dL (ref 6.5–8.1)

## 2016-09-22 LAB — CBC
HEMATOCRIT: 35.6 % — AB (ref 36.0–46.0)
HEMOGLOBIN: 11.5 g/dL — AB (ref 12.0–15.0)
MCH: 28.4 pg (ref 26.0–34.0)
MCHC: 32.3 g/dL (ref 30.0–36.0)
MCV: 87.9 fL (ref 78.0–100.0)
Platelets: 491 10*3/uL — ABNORMAL HIGH (ref 150–400)
RBC: 4.05 MIL/uL (ref 3.87–5.11)
RDW: 13.9 % (ref 11.5–15.5)
WBC: 8.2 10*3/uL (ref 4.0–10.5)

## 2016-09-22 LAB — LIPASE, BLOOD: Lipase: 38 U/L (ref 11–51)

## 2016-09-22 LAB — I-STAT BETA HCG BLOOD, ED (MC, WL, AP ONLY)

## 2016-09-22 NOTE — ED Triage Notes (Signed)
Patient c/o mid lower abdominal pain and dysuria x 1 week. Patient denies any fever, N/V/d.

## 2016-09-22 NOTE — ED Provider Notes (Signed)
[facility] DEPT Provider Note   CSN: [id number] Arrival date & time: 09/22/16  1829  By signing my name below, I, Ny'Kea Lewis, attest that this documentation has been prepared under the direction and in the presence of Delora Fuel, MD. Electronically Signed: Lise Auer, ED Scribe. 09/23/16. 12:22 AM.  History   Chief Complaint Chief Complaint  Patient presents with  . Abdominal Pain  . Dysuria   The history is provided by the patient. A language interpreter was used.    HPI Comments: Lauren [name] is a 41 y.o. female who presents to the Emergency Department complaining of consistent difficulty urinating for one week, she rates the severity of her pain a 8/10. She notes associated intermittent eft lower quadrant abdominal pain that she describes as pressure-like that has become persistent. She reports having difficulty urination for the past week and describes it as pressure and unable to produce her normal void. Pt notes she has a stent placed on her left kidney on 06/25/16, and was told it would be taken off in two months, but she has not received word on when it will be removed. She believes her pain is secondary to the stent. She is followed Dr. Tresa Moore for her kidneys. No treatment tried. Denies nausea, vomiting, diarrhea, or any other acute associated symptoms.  Past Medical History:  Diagnosis Date  . Abnormal Pap smear   . AMA (advanced maternal age) multigravida 44+   . Headache(784.0)   . Hx of chlamydia infection   . Hx of ectopic pregnancy   . Hx: UTI (urinary tract infection)   . Language barrier   . Late prenatal care   . Thrombocytosis ([place])   . Vaginal Pap smear, abnormal    Patient Active Problem List   Diagnosis Date Noted  . Ureteral fistula 06/25/2016  . Pregnancy 06/07/2016  . Gall bladder disease 10/17/2012   Past Surgical History:  Procedure Laterality Date  . APPENDECTOMY    . CESAREAN SECTION N/A 06/08/2016   Procedure: CESAREAN  SECTION;  Surgeon: Mora Bellman, MD;  Location: [facility];  Service: Obstetrics;  Laterality: N/A;  . CHOLECYSTECTOMY N/A 10/19/2012   Procedure: LAPAROSCOPIC CHOLECYSTECTOMY WITH INTRAOPERATIVE CHOLANGIOGRAM;  Surgeon: Shann Medal, MD;  Location: [facility];  Service: General;  Laterality: N/A;  . CYSTOSCOPY W/ URETERAL STENT PLACEMENT Left 06/25/2016   Procedure: CYSTOSCOPY WITH RETROGRADE PYELOGRAM/LEFT URETERAL STENT PLACEMENT;  Surgeon: Alexis Frock, MD;  Location: [facility];  Service: Urology;  Laterality: Left;  . ECTOPIC PREGNANCY SURGERY  6years ago  . SALPINGECTOMY     OB History    Gravida Para Term Preterm AB Living   6 4 4  0 1 4   SAB TAB Ectopic Multiple Live Births   0 0 1 0 4     Home Medications    Prior to Admission medications   Medication Sig Start Date End Date Taking? Authorizing Provider  Ferrous Fumarate (HEMOCYTE - 106 MG FE) 324 (106 Fe) MG TABS tablet Take 1 tablet (106 mg of iron total) by mouth 2 (two) times daily. Patient not taking: Reported on 06/24/2016 06/10/16   Myrtis Ser, CNM  ibuprofen (ADVIL,MOTRIN) 200 MG tablet Take 400 mg by mouth every 6 (six) hours as needed for moderate pain.    [provider]  ibuprofen (ADVIL,MOTRIN) 600 MG tablet Take 1 tablet (600 mg total) by mouth every 6 (six) hours as needed. Patient not taking: Reported on 06/24/2016 06/10/16   Myrtis Ser, CNM  Family History Family History  Problem Relation Age of Onset  . Seizures Son   . Hypertension Son    Social History Social History  Substance Use Topics  . Smoking status: Never Smoker  . Smokeless tobacco: Never Used  . Alcohol use No    Allergies   Patient has no known allergies.   Review of Systems Review of Systems  Gastrointestinal: Positive for abdominal pain.  Genitourinary: Positive for difficulty urinating.  All other systems reviewed and are negative.  Physical Exam Updated Vital Signs BP 116/72 (BP Location: Left Arm)    Pulse 80   Temp 98.1 F (36.7 C) (Oral)   Resp 20   Ht 5\' 4"  (1.626 m)   Wt 168 lb 6 oz (76.4 kg)   LMP 09/22/2016   SpO2 99%   BMI 28.90 kg/m   Physical Exam  Constitutional: She is oriented to person, place, and time. She appears well-developed and well-nourished.  HENT:  Head: Normocephalic and atraumatic.  Eyes: EOM are normal. Pupils are equal, round, and reactive to light.  Neck: Normal range of motion. Neck supple. No JVD present.  Cardiovascular: Normal rate, regular rhythm and normal heart sounds.   No murmur heard. Pulmonary/Chest: Effort normal and breath sounds normal. She has no wheezes. She has no rales. She exhibits no tenderness.  Abdominal: Soft. Bowel sounds are normal. She exhibits no distension and no mass. There is tenderness.  Tenderness to the left lower quadrant. No upper abdominal or CVA tenderness.   Musculoskeletal: Normal range of motion. She exhibits no edema.  Lymphadenopathy:    She has no cervical adenopathy.  Neurological: She is alert and oriented to person, place, and time. No cranial nerve deficit. She exhibits normal muscle tone. Coordination normal.  Skin: Skin is warm and dry. No rash noted.  Psychiatric: She has a normal mood and affect. Her behavior is normal. Judgment and thought content normal.  Nursing note and vitals reviewed.  ED Treatments / Results  DIAGNOSTIC STUDIES: Oxygen Saturation is 99% on RA, normal by my interpretation.   COORDINATION OF CARE: 11:04 PM-Discussed next steps with pt. Pt verbalized understanding and is agreeable with the plan.   Labs (all labs ordered are listed, but only abnormal results are displayed) Labs Reviewed  COMPREHENSIVE METABOLIC PANEL - Abnormal; Notable for the following:       Result Value   Potassium 3.4 (*)    Glucose, Bld 101 (*)    BUN 21 (*)    AST 51 (*)    ALT 92 (*)    Alkaline Phosphatase 283 (*)    All other components within normal limits  CBC - Abnormal; Notable for the  following:    Hemoglobin 11.5 (*)    HCT 35.6 (*)    Platelets 491 (*)    All other components within normal limits  URINALYSIS, ROUTINE W REFLEX MICROSCOPIC - Abnormal; Notable for the following:    APPearance HAZY (*)    Hgb urine dipstick LARGE (*)    Protein, ur 100 (*)    Leukocytes, UA MODERATE (*)    Squamous Epithelial / LPF 0-5 (*)    All other components within normal limits  LIPASE, BLOOD  I-STAT BETA HCG BLOOD, ED ([facility], [facility], [facility] ONLY)    Radiology US Abdomen Complete  Result Date: 09/23/2016 CLINICAL DATA:  Acute onset of left lower quadrant abdominal pain. Abnormal LFTs. Initial encounter. EXAM: ABDOMEN ULTRASOUND COMPLETE COMPARISON:  CT of the abdomen and pelvis from  06/24/2016 FINDINGS: Gallbladder: Status post cholecystectomy.  No retained stones seen. Common bile duct: Diameter: 0.6 cm, within normal limits in caliber. Liver: No focal lesion identified. Mildly increased parenchymal echogenicity and coarsened echotexture likely reflects fatty infiltration. IVC: No abnormality visualized. Pancreas: Visualized portion unremarkable. Spleen: Size and appearance within normal limits. Right Kidney: Length: 10.8 cm. Echogenicity within normal limits. No mass or hydronephrosis visualized. Left Kidney: Length: 10.3 cm. Echogenicity within normal limits. No mass or hydronephrosis visualized. Abdominal aorta: No aneurysm visualized. Other findings: None. IMPRESSION: 1. No acute abnormality seen within the abdomen. 2. Diffuse fatty infiltration within the liver. 3. Status post cholecystectomy. Electronically Signed   By: Garald Balding M.D.   On: 09/23/2016 01:25    Procedures Procedures (including critical care time)  Medications Ordered in ED Medications - No data to display   Initial Impression / Assessment and Plan / ED Course  I have reviewed the triage vital signs and the nursing notes.  Pertinent labs & imaging results that were available during my care of the patient were  reviewed by me and considered in my medical decision making (see chart for details).  Patient with ureteral stent because of ureteral injury. Review of old records shows it was placed on March 4. Ongoing abdominal pain which may be related to stent. Laboratory workup ordered before my seeing the patient showed elevated liver function tests. She is status post cholecystectomy. She was sent for ultrasound which showed fatty liver infiltration but no evidence of choledocholithiasis. No evidence of hydronephrosis. She is referred back to her urologist regarding timing of stent removal and is referred to gastroenterology for evaluation of abnormal liver functions.  Final Clinical Impressions(s) / ED Diagnoses   Final diagnoses:  LLQ pain  Elevated liver function tests  Normochromic normocytic anemia  Thrombocytosis (HCC)    New Prescriptions New Prescriptions   OXYCODONE (ROXICODONE) 5 MG IMMEDIATE RELEASE TABLET    Take 1 tablet (5 mg total) by mouth every 4 (four) hours as needed for severe pain.   I personally performed the services described in this documentation, which was scribed in my presence. The recorded information has been reviewed and is accurate.       Delora Fuel, MD 96/75/91 [phone]

## 2016-09-23 LAB — URINALYSIS, ROUTINE W REFLEX MICROSCOPIC
Bacteria, UA: NONE SEEN
Bilirubin Urine: NEGATIVE
GLUCOSE, UA: NEGATIVE mg/dL
Ketones, ur: NEGATIVE mg/dL
Nitrite: NEGATIVE
PH: 5 (ref 5.0–8.0)
Protein, ur: 100 mg/dL — AB
SPECIFIC GRAVITY, URINE: 1.019 (ref 1.005–1.030)

## 2016-09-23 MED ORDER — POTASSIUM CHLORIDE CRYS ER 20 MEQ PO TBCR
40.0000 meq | EXTENDED_RELEASE_TABLET | Freq: Once | ORAL | Status: AC
  Administered 2016-09-23: 40 meq via ORAL
  Filled 2016-09-23: qty 2

## 2016-09-23 MED ORDER — OXYCODONE HCL 5 MG PO TABS: 5.0000 mg | ORAL_TABLET | ORAL | 0 refills | Status: DC | PRN

## 2016-09-23 NOTE — Discharge Instructions (Signed)
°  Debe llamar al urlogo para programar una cita. l tiene que decidir cundo quitar el stent. Le envi un correo electrnico para hacerle saber que estabas aqu esta noche.   Los anlisis de sangre del hgado son anormales. La ecografa muestra infiltracin grasa del hgado. Debes abstenerte del alcohol Haga una cita con el gastroenterlogo para Educational psychologist.

## 2016-10-18 ENCOUNTER — Other Ambulatory Visit: Payer: Self-pay | Admitting: Urology

## 2016-10-27 ENCOUNTER — Encounter (HOSPITAL_BASED_OUTPATIENT_CLINIC_OR_DEPARTMENT_OTHER): Payer: Self-pay | Admitting: *Deleted

## 2016-10-28 ENCOUNTER — Encounter (HOSPITAL_BASED_OUTPATIENT_CLINIC_OR_DEPARTMENT_OTHER): Payer: Self-pay | Admitting: *Deleted

## 2016-10-28 NOTE — Progress Notes (Signed)
To [facility] at 0915-Istat,urine pregnancy on arrival-Npo after Mn-Will arrange for interpreter DOS.

## 2016-11-04 NOTE — Anesthesia Postprocedure Evaluation (Signed)
Anesthesia Post Note  Patient: Lauren [name]  Procedure(s) Performed: Procedure(s) (LRB): CESAREAN SECTION (N/A)     Anesthesia Post Evaluation  Last Vitals:  Vitals:   06/10/16 0523 06/10/16 1012  BP: 113/64 118/78  Pulse: (!) 101 88  Resp: 18 18  Temp: 37.2 C 37.3 C    Last Pain:  Vitals:   06/10/16 1208  TempSrc:   PainSc: 0-No pain                 Julus Kelley EDWARD

## 2016-11-04 NOTE — Addendum Note (Signed)
Addendum  created 11/04/16 1651 by Lyndle Herrlich, MD   Sign clinical note

## 2016-11-09 ENCOUNTER — Ambulatory Visit (HOSPITAL_BASED_OUTPATIENT_CLINIC_OR_DEPARTMENT_OTHER)
Admission: RE | Admit: 2016-11-09 | Discharge: 2016-11-09 | Disposition: A | Discharge status: Home / Self Care | Payer: Medicaid Other | Source: Ambulatory Visit | Attending: Urology | Admitting: Urology

## 2016-11-09 ENCOUNTER — Ambulatory Visit (HOSPITAL_BASED_OUTPATIENT_CLINIC_OR_DEPARTMENT_OTHER): Payer: Medicaid Other | Admitting: Anesthesiology

## 2016-11-09 ENCOUNTER — Encounter (HOSPITAL_BASED_OUTPATIENT_CLINIC_OR_DEPARTMENT_OTHER): Payer: Self-pay | Admitting: *Deleted

## 2016-11-09 ENCOUNTER — Encounter (HOSPITAL_BASED_OUTPATIENT_CLINIC_OR_DEPARTMENT_OTHER)
Admission: RE | Disposition: A | Discharge status: Home / Self Care | Payer: Self-pay | Source: Ambulatory Visit | Attending: Urology

## 2016-11-09 DIAGNOSIS — Y838 Other surgical procedures as the cause of abnormal reaction of the patient, or of later complication, without mention of misadventure at the time of the procedure: Secondary | ICD-10-CM | POA: Insufficient documentation

## 2016-11-09 DIAGNOSIS — Y763 Surgical instruments, materials and obstetric and gynecological devices (including sutures) associated with adverse incidents: Secondary | ICD-10-CM | POA: Insufficient documentation

## 2016-11-09 DIAGNOSIS — N135 Crossing vessel and stricture of ureter without hydronephrosis: Secondary | ICD-10-CM | POA: Insufficient documentation

## 2016-11-09 DIAGNOSIS — Z87442 Personal history of urinary calculi: Secondary | ICD-10-CM | POA: Insufficient documentation

## 2016-11-09 DIAGNOSIS — S3719XD Other injury of ureter, subsequent encounter: Secondary | ICD-10-CM | POA: Insufficient documentation

## 2016-11-09 HISTORY — DX: Personal history of urinary calculi: Z87.442

## 2016-11-09 HISTORY — PX: CYSTOSCOPY WITH RETROGRADE PYELOGRAM, URETEROSCOPY AND STENT PLACEMENT: SHX5789

## 2016-11-09 LAB — POCT I-STAT, CHEM 8
BUN: 13 mg/dL (ref 6–20)
CALCIUM ION: 1.24 mmol/L (ref 1.15–1.40)
CREATININE: 0.5 mg/dL (ref 0.44–1.00)
Chloride: 104 mmol/L (ref 101–111)
Glucose, Bld: 107 mg/dL — ABNORMAL HIGH (ref 65–99)
HCT: 37 % (ref 36.0–46.0)
Hemoglobin: 12.6 g/dL (ref 12.0–15.0)
Potassium: 3.8 mmol/L (ref 3.5–5.1)
Sodium: 141 mmol/L (ref 135–145)
TCO2: 27 mmol/L (ref 0–100)

## 2016-11-09 LAB — POCT PREGNANCY, URINE: Preg Test, Ur: NEGATIVE

## 2016-11-09 SURGERY — CYSTOURETEROSCOPY, WITH RETROGRADE PYELOGRAM AND STENT INSERTION
Anesthesia: General | Laterality: Left

## 2016-11-09 MED ORDER — LIDOCAINE 2% (20 MG/ML) 5 ML SYRINGE
INTRAMUSCULAR | Status: AC
  Filled 2016-11-09: qty 5

## 2016-11-09 MED ORDER — GENTAMICIN SULFATE 40 MG/ML IJ SOLN
5.0000 mg/kg | INTRAVENOUS | Status: AC
  Administered 2016-11-09: 320 mg via INTRAVENOUS
  Filled 2016-11-09: qty 8

## 2016-11-09 MED ORDER — PROPOFOL 10 MG/ML IV BOLUS
INTRAVENOUS | Status: AC
  Filled 2016-11-09: qty 40

## 2016-11-09 MED ORDER — LACTATED RINGERS IV SOLN
INTRAVENOUS | Status: DC
  Administered 2016-11-09 (×2): via INTRAVENOUS
  Filled 2016-11-09: qty 1000

## 2016-11-09 MED ORDER — HYDROMORPHONE HCL 1 MG/ML IJ SOLN
0.2500 mg | INTRAMUSCULAR | Status: DC | PRN
  Filled 2016-11-09: qty 0.5

## 2016-11-09 MED ORDER — MIDAZOLAM HCL 2 MG/2ML IJ SOLN
INTRAMUSCULAR | Status: AC
  Filled 2016-11-09: qty 2

## 2016-11-09 MED ORDER — DEXAMETHASONE SODIUM PHOSPHATE 4 MG/ML IJ SOLN
INTRAMUSCULAR | Status: DC | PRN
  Administered 2016-11-09: 10 mg via INTRAVENOUS

## 2016-11-09 MED ORDER — ONDANSETRON HCL 4 MG/2ML IJ SOLN
INTRAMUSCULAR | Status: AC
  Filled 2016-11-09: qty 2

## 2016-11-09 MED ORDER — FENTANYL CITRATE (PF) 100 MCG/2ML IJ SOLN
INTRAMUSCULAR | Status: DC | PRN
  Administered 2016-11-09 (×4): 25 ug via INTRAVENOUS

## 2016-11-09 MED ORDER — DEXAMETHASONE SODIUM PHOSPHATE 10 MG/ML IJ SOLN
INTRAMUSCULAR | Status: AC
  Filled 2016-11-09: qty 1

## 2016-11-09 MED ORDER — FENTANYL CITRATE (PF) 100 MCG/2ML IJ SOLN
INTRAMUSCULAR | Status: AC
  Filled 2016-11-09: qty 2

## 2016-11-09 MED ORDER — KETOROLAC TROMETHAMINE 30 MG/ML IJ SOLN
INTRAMUSCULAR | Status: DC | PRN
  Administered 2016-11-09: 30 mg via INTRAVENOUS

## 2016-11-09 MED ORDER — TRAMADOL HCL 50 MG PO TABS: 50.0000 mg | ORAL_TABLET | Freq: Four times a day (QID) | ORAL | 0 refills | Status: DC | PRN

## 2016-11-09 MED ORDER — MIDAZOLAM HCL 5 MG/5ML IJ SOLN
INTRAMUSCULAR | Status: DC | PRN
  Administered 2016-11-09: 2 mg via INTRAVENOUS

## 2016-11-09 MED ORDER — PROPOFOL 10 MG/ML IV BOLUS
INTRAVENOUS | Status: DC | PRN
  Administered 2016-11-09: 200 mg via INTRAVENOUS

## 2016-11-09 MED ORDER — LIDOCAINE 2% (20 MG/ML) 5 ML SYRINGE
INTRAMUSCULAR | Status: DC | PRN
  Administered 2016-11-09: 100 mg via INTRAVENOUS

## 2016-11-09 MED ORDER — SENNOSIDES-DOCUSATE SODIUM 8.6-50 MG PO TABS: 1.0000 | ORAL_TABLET | Freq: Two times a day (BID) | ORAL | 0 refills | Status: DC

## 2016-11-09 MED ORDER — ONDANSETRON HCL 4 MG/2ML IJ SOLN
4.0000 mg | Freq: Once | INTRAMUSCULAR | Status: DC | PRN
  Filled 2016-11-09: qty 2

## 2016-11-09 MED ORDER — ONDANSETRON HCL 4 MG/2ML IJ SOLN
INTRAMUSCULAR | Status: DC | PRN
  Administered 2016-11-09: 4 mg via INTRAVENOUS

## 2016-11-09 MED ORDER — GENTAMICIN SULFATE 40 MG/ML IJ SOLN
5.0000 mg/kg | INTRAVENOUS | Status: DC
  Filled 2016-11-09: qty 9.5

## 2016-11-09 MED ORDER — KETOROLAC TROMETHAMINE 30 MG/ML IJ SOLN
INTRAMUSCULAR | Status: AC
  Filled 2016-11-09: qty 1

## 2016-11-09 MED ORDER — MEPERIDINE HCL 25 MG/ML IJ SOLN
6.2500 mg | INTRAMUSCULAR | Status: DC | PRN
  Filled 2016-11-09: qty 1

## 2016-11-09 SURGICAL SUPPLY — 24 items
BAG DRAIN URO-CYSTO SKYTR STRL (DRAIN) ×3 IMPLANT
BASKET LASER NITINOL 1.9FR (BASKET) IMPLANT
CATH INTERMIT  6FR 70CM (CATHETERS) IMPLANT
CLOTH BEACON ORANGE TIMEOUT ST (SAFETY) ×3 IMPLANT
FIBER LASER FLEXIVA 365 (UROLOGICAL SUPPLIES) IMPLANT
FIBER LASER TRAC TIP (UROLOGICAL SUPPLIES) IMPLANT
GLOVE BIO SURGEON STRL SZ7.5 (GLOVE) ×3 IMPLANT
GOWN STRL REUS W/TWL LRG LVL3 (GOWN DISPOSABLE) ×3 IMPLANT
GOWN STRL REUS W/TWL XL LVL3 (GOWN DISPOSABLE) IMPLANT
GUIDEWIRE ANG ZIPWIRE 038X150 (WIRE) ×3 IMPLANT
GUIDEWIRE STR DUAL SENSOR (WIRE) ×3 IMPLANT
INFUSOR MANOMETER BAG 3000ML (MISCELLANEOUS) ×3 IMPLANT
IV NS 1000ML (IV SOLUTION) ×2
IV NS 1000ML BAXH (IV SOLUTION) ×1 IMPLANT
IV NS IRRIG 3000ML ARTHROMATIC (IV SOLUTION) ×3 IMPLANT
KIT RM TURNOVER CYSTO AR (KITS) ×3 IMPLANT
MANIFOLD NEPTUNE II (INSTRUMENTS) IMPLANT
NS IRRIG 500ML POUR BTL (IV SOLUTION) ×3 IMPLANT
PACK CYSTO (CUSTOM PROCEDURE TRAY) ×3 IMPLANT
STENT URET 6FRX24 CONTOUR (STENTS) ×3 IMPLANT
SYRINGE 10CC LL (SYRINGE) ×3 IMPLANT
TUBE CONNECTING 12'X1/4 (SUCTIONS)
TUBE CONNECTING 12X1/4 (SUCTIONS) IMPLANT
TUBE FEEDING 8FR 16IN STR KANG (MISCELLANEOUS) ×3 IMPLANT

## 2016-11-09 NOTE — H&P (Signed)
Lauren [name] is an 41 y.o. female.    Chief Complaint: Pre-op LEFT ureteral stent change / diagnostic ureteroscopy  HPI:   1 - Left Ureterovaginal Fistula / Flank Pain / Urinoma - G4P4 s/p cesarean on 06/08/16 by P. Constant MD with resultant left ureteral injury / VVF / Urinoma. Cysto / retrograde / 6/24 contour stent 06/2016 confirms stricure / fistula approx 3cm proximal to UVJ.   PMH sig for lap chole, cesarean x1. Spanish only. She denies CV disease / blood thinners / baseline medications.   Today "Lauren [name]" is seen to proceed with diagnostic left ureteroscopy to verify healing / status of prior ureterovaginal fistula. No uriniferous vaginal discharge in months. Most recent UCX negative. She has some stent colic symptoms as expected.    Past Medical History:  Diagnosis Date  . Headache(784.0)   . History of kidney stones 06/25/2016  . Hx of chlamydia infection   . Hx of ectopic pregnancy    09-19-2002  s/p  left salpingectomy  . Thrombocytosis ([place])     Past Surgical History:  Procedure Laterality Date  . APPENDECTOMY    . CESAREAN SECTION N/A 06/08/2016   Procedure: CESAREAN SECTION;  Surgeon: Mora Bellman, MD;  Location: [facility];  Service: Obstetrics;  Laterality: N/A;  . CHOLECYSTECTOMY N/A 10/19/2012   Procedure: LAPAROSCOPIC CHOLECYSTECTOMY WITH INTRAOPERATIVE CHOLANGIOGRAM;  Surgeon: Shann Medal, MD;  Location: [facility];  Service: General;  Laterality: N/A;  . CYSTOSCOPY W/ URETERAL STENT PLACEMENT Left 06/25/2016   Procedure: CYSTOSCOPY WITH RETROGRADE PYELOGRAM/LEFT URETERAL STENT PLACEMENT;  Surgeon: Alexis Frock, MD;  Location: [facility];  Service: Urology;  Laterality: Left;  . LAPROSCOPY W/ LEFT SALPINGECTOMY FOR ECTOPIC   09/19/2002    Family History  Problem Relation Age of Onset  . Seizures Son   . Hypertension Son    Social History:  reports that she has never smoked. She has never used smokeless tobacco. She reports that she does not  drink alcohol or use drugs.  Allergies: No Known Allergies  No prescriptions prior to admission.    No results found for this or any previous visit (from the past 48 hour(s)). No results found.  Review of Systems  Constitutional: Negative.  Negative for chills and fever.  HENT: Negative.   Eyes: Negative.   Respiratory: Negative.   Cardiovascular: Negative.   Gastrointestinal: Negative.   Genitourinary: Positive for frequency and urgency. Negative for flank pain.  Musculoskeletal: Negative.   Skin: Negative.   Neurological: Negative.   Endo/Heme/Allergies: Negative.   Psychiatric/Behavioral: Negative.     Height 5\' 4"  (1.626 m), weight 76.2 kg (168 lb), last menstrual period 10/28/2016, unknown if currently breastfeeding. Physical Exam  Constitutional: She appears well-developed.  Eyes: Pupils are equal, round, and reactive to light.  Neck: Normal range of motion.  Cardiovascular: Normal rate.   Respiratory: Effort normal.  GI:  Prior scars w/o hernias.   Genitourinary:  Genitourinary Comments: No CVAT at present.   Musculoskeletal: Normal range of motion.  Neurological: She is alert.  Skin: Skin is warm.  Psychiatric: She has a normal mood and affect.     Assessment/Plan  1 - Left Ureterovaginal Fistula / Flank Pain / Urinoma - proceed as planned with cysto, LEFT retrograde / diagnostic ureteroscopy / possible stent exchange. Risks, beneftis, alternatives, expected peri-op course discussed previously and reiterated today.   Alexis Frock, MD 11/09/2016, 6:09 AM

## 2016-11-09 NOTE — Anesthesia Preprocedure Evaluation (Signed)
Anesthesia Evaluation  Patient identified by MRN, date of birth, ID band Patient awake    Reviewed: Allergy & Precautions, NPO status , Patient's Chart, lab work & pertinent test results  Airway Mallampati: I  TM Distance: >3 FB Neck ROM: Full    Dental   Pulmonary    Pulmonary exam normal        Cardiovascular Normal cardiovascular exam     Neuro/Psych    GI/Hepatic   Endo/Other    Renal/GU      Musculoskeletal   Abdominal   Peds  Hematology   Anesthesia Other Findings   Reproductive/Obstetrics                             Anesthesia Physical Anesthesia Plan  ASA: II  Anesthesia Plan: General   Post-op Pain Management:    Induction: Intravenous  PONV Risk Score and Plan: 3 and Ondansetron, Dexamethasone, Propofol and Midazolam  Airway Management Planned: LMA  Additional Equipment:   Intra-op Plan:   Post-operative Plan: Extubation in OR  Informed Consent: I have reviewed the patients History and Physical, chart, labs and discussed the procedure including the risks, benefits and alternatives for the proposed anesthesia with the patient or authorized representative who has indicated his/her understanding and acceptance.     Plan Discussed with: CRNA and Surgeon  Anesthesia Plan Comments:         Anesthesia Quick Evaluation

## 2016-11-09 NOTE — Brief Op Note (Signed)
11/09/2016  10:47 AM  PATIENT:  Claudie Leach Prestegui-Bedolla  41 y.o. female  PRE-OPERATIVE DIAGNOSIS:  LEFT URETERAL INJURY  POST-OPERATIVE DIAGNOSIS:  LEFT URETERAL INJURY  PROCEDURE:  Procedure(s): CYSTOSCOPY WITH RETROGRADE PYELOGRAM, DIAGNOSTIC URETEROSCOPY AND STENT REPLACEMENT (Left)  SURGEON:  Surgeon(s) and Role:    * Alexis Frock, MD - Primary  PHYSICIAN ASSISTANT:   ASSISTANTS: none   ANESTHESIA:   general  EBL:  Total I/O In: 200 [I.V.:200] Out: -   BLOOD ADMINISTERED:none  DRAINS: none   LOCAL MEDICATIONS USED:  NONE  SPECIMEN:  No Specimen  DISPOSITION OF SPECIMEN:  N/A  COUNTS:  YES  TOURNIQUET:  * No tourniquets in log *  DICTATION: .Other Dictation: Dictation Number [id number]  PLAN OF CARE: Discharge to home after PACU  PATIENT DISPOSITION:  PACU - hemodynamically stable.   Delay start of Pharmacological VTE agent (>24hrs) due to surgical blood loss or risk of bleeding: yes

## 2016-11-09 NOTE — Anesthesia Procedure Notes (Signed)
Procedure Name: LMA Insertion Date/Time: 11/09/2016 10:21 AM Performed by: Justice Rocher Pre-anesthesia Checklist: Patient identified, Emergency Drugs available, Suction available and Patient being monitored Patient Re-evaluated:Patient Re-evaluated prior to induction Oxygen Delivery Method: Circle system utilized Preoxygenation: Pre-oxygenation with 100% oxygen Induction Type: IV induction Ventilation: Mask ventilation without difficulty LMA: LMA inserted LMA Size: 4.0 Number of attempts: 1 Airway Equipment and Method: Bite block Placement Confirmation: positive ETCO2 and breath sounds checked- equal and bilateral Tube secured with: Tape Dental Injury: Teeth and Oropharynx as per pre-operative assessment

## 2016-11-09 NOTE — Transfer of Care (Signed)
Immediate Anesthesia Transfer of Care Note  Patient: Lauren [name]  Procedure(s) Performed: Procedure(s) (LRB): CYSTOSCOPY WITH RETROGRADE PYELOGRAM, DIAGNOSTIC URETEROSCOPY AND STENT REPLACEMENT (Left)  Patient Location: PACU  Anesthesia Type: General  Level of Consciousness: awake, sedated, patient cooperative and responds to stimulation  Airway & Oxygen Therapy: Patient Spontanous Breathing and Patient connected to NCO2  Post-op Assessment: Report given to PACU RN, Post -op Vital signs reviewed and stable and Patient moving all extremities  Post vital signs: Reviewed and stable  Complications: No apparent anesthesia complications

## 2016-11-09 NOTE — Interval H&P Note (Signed)
History and Physical Interval Note:  11/09/2016 10:20 AM  Lauren [name]  has presented today for surgery, with the diagnosis of LEFT URETERAL INJURY  The various methods of treatment have been discussed with the patient and family. After consideration of risks, benefits and other options for treatment, the patient has consented to  Procedure(s): CYSTOSCOPY WITH RETROGRADE PYELOGRAM, DIAGNOSTIC URETEROSCOPY AND STENT REPLACEMENT (Left) as a surgical intervention .  The patient's history has been reviewed, patient examined, no change in status, stable for surgery.  I have reviewed the patient's chart and labs.  Questions were answered to the patient's satisfaction.     [name]

## 2016-11-09 NOTE — Discharge Instructions (Signed)
1 - You may have urinary urgency (bladder spasms) and bloody urine on / off with stent in place. This is normal.  2 - Call MD or go to ER for fever >102, severe pain / nausea / vomiting not relieved by medications, or acute change in medical status CYSTOSCOPY HOME CARE INSTRUCTIONS  Activity: Rest for the remainder of the day.  Do not drive or operate equipment today.  You may resume normal activities in one to two days as instructed by your physician.   Meals: Drink plenty of liquids and eat light foods such as gelatin or soup this evening.  You may return to a normal meal plan tomorrow.  Return to Work: You may return to work in one to two days or as instructed by your physician.  Special Instructions / Symptoms: Call your physician if any of these symptoms occur:   - heavy bleeding  -large blood clots that are difficult to pass  -urine stream diminishes or stops completely  -fever equal to or higher than 101 degrees Farenheit.  -cloudy urine with a strong, foul odor  -severe pain  Females should always wipe from front to back after elimination.  You may feel some burning pain when you urinate.  This should disappear with time.  Applying moist heat to the lower abdomen or a hot tub bath may help relieve the pain.  Post Anesthesia Home Care Instructions  Activity: Get plenty of rest for the remainder of the day. A responsible individual must stay with you for 24 hours following the procedure.  For the next 24 hours, DO NOT: -Drive a car -Paediatric nurse -Drink alcoholic beverages -Take any medication unless instructed by your physician -Make any legal decisions or sign important papers.  Meals: Start with liquid foods such as gelatin or soup. Progress to regular foods as tolerated. Avoid greasy, spicy, heavy foods. If nausea and/or vomiting occur, drink only clear liquids until the nausea and/or vomiting subsides. Call your physician if vomiting continues.  Special  Instructions/Symptoms: Your throat may feel dry or sore from the anesthesia or the breathing tube placed in your throat during surgery. If this causes discomfort, gargle with warm salt water. The discomfort should disappear within 24 hours.  If you had a scopolamine patch placed behind your ear for the management of post- operative nausea and/or vomiting:  1. The medication in the patch is effective for 72 hours, after which it should be removed.  Wrap patch in a tissue and discard in the trash. Wash hands thoroughly with soap and water. 2. You may remove the patch earlier than 72 hours if you experience unpleasant side effects which may include dry mouth, dizziness or visual disturbances. 3. Avoid touching the patch. Wash your hands with soap and water after contact with the patch.

## 2016-11-09 NOTE — Anesthesia Postprocedure Evaluation (Signed)
Anesthesia Post Note  Patient: Lauren [name]  Procedure(s) Performed: Procedure(s) (LRB): CYSTOSCOPY WITH RETROGRADE PYELOGRAM, DIAGNOSTIC URETEROSCOPY AND STENT REPLACEMENT (Left)     Patient location during evaluation: PACU Anesthesia Type: General Level of consciousness: awake and alert Pain management: pain level controlled Vital Signs Assessment: post-procedure vital signs reviewed and stable Respiratory status: spontaneous breathing, nonlabored ventilation, respiratory function stable and patient connected to nasal cannula oxygen Cardiovascular status: blood pressure returned to baseline and stable Postop Assessment: no signs of nausea or vomiting Anesthetic complications: no    Last Vitals:  Vitals:   11/09/16 1230 11/09/16 1245  BP: (!) 103/57 (!) 98/54  Pulse: 60 (!) 58  Resp: 16 16  Temp:      Last Pain:  Vitals:   11/09/16 1230  TempSrc:   PainSc: Aldan DAVID

## 2016-11-10 ENCOUNTER — Encounter (HOSPITAL_BASED_OUTPATIENT_CLINIC_OR_DEPARTMENT_OTHER): Payer: Self-pay | Admitting: Urology

## 2016-11-10 NOTE — Op Note (Signed)
NAME:  Lauren [name], Lauren NO.:  MEDICAL RECORD NO.:  [id number]  LOCATION:                                 FACILITY:  PHYSICIAN:  Alexis Frock, MD     DATE OF BIRTH:  Jul 05, 1975  DATE OF PROCEDURE: 11/09/2016                               OPERATIVE REPORT   DIAGNOSIS:  History of left ureteral injury with left ureterovaginal fistula.  PROCEDURES: 1. Cystoscopy with left retrograde pyelogram and interpretation. 2. Exchange of left ureteral stent, 6 x 24 Contour, no tether. 3. Left diagnostic ureteroscopy.  ESTIMATED BLOOD LOSS:  Nil.  COMPLICATION:  None.  SPECIMEN:  None.  FINDINGS: 1. High-grade short-segment stricture just proximal to the intramural     ureter on the left. 2. Resolution of prior fistula. 3. Successful replacement of left ureteral stent, proximal in the     renal pelvis and distal in the urinary bladder.  INDICATION:  Lauren [name] is a very pleasant 41 year old woman with history of left ureteral injury with subsequent ureterovaginal fistula. After cesarean section several months ago, she fortunately was able to have a stent placed across this and the fistula portion resolved clinically.  Given the likely etiology with stitch across ureter, it was clearly felt that diagnostic ureteroscopy would be warranted before removal or replacement of the stent, to ensure no stricturing in the area.  Informed consent was obtained and placed in the medical record.  PROCEDURE IN DETAIL:  The patient being Lauren [name], was verified.  Procedure being cysto, left retrograde, left diagnostic ureteroscopy and stent replacement was confirmed.  Procedure was carried out.  Time-out was carried.  Intravenous antibiotics were administered. General LMA anesthesia was introduced.  The patient was placed into a low lithotomy position and sterile field was created by prepping and draping the patient's vagina, introitus, and proximal  thighs using iodine.  Next, cystourethroscopy was performed using a 20-French rigid cystoscope with offset lens.  Inspection of the urinary bladder revealed no diverticula, calcifications, papillary lesions.  There was a slight diverting of the area of bladder superior and medial to the left ureteral orifice likely consistent with tethering from prior suture. Distal end of the stent was grasped, brought to the level of the urethral meatus, and a 0.038 Zip wire was advanced to the level of the kidney and the stent was exchanged for an open-ended catheter and left retrograde pyelogram was obtained.  Left retrograde pyelogram demonstrated a single left ureter with single- system left kidney.  There was no hydronephrosis at this time.  With pulling the open-ended catheter, I gently back, there were was noted to be no evidence of fistula; however, there was very high-grade short- segment narrowing of the very distal ureter consistent with likely stricture in this location.  The Zip wire was once again advanced to the level of the upper pole, set aside as a safety wire.  An 8-French feeding tube was placed in the urinary bladder for pressure release and semi-rigid ureteroscopy was performed to the distal ureter alongside a separate Sensor working wire.  The intramural ureter on the left was unremarkable; however, at the position approximately 1-2 cm above the level of intramural ureter, a very high-grade stricture was clearly  noted, this would not accommodate two wires and certainly not accommodate the semi-rigid scope despite having stent in place for number of months, this was consistent with high-grade stricture, and the patient will clearly require a left ureteral reimplant, become stent free long-term.  Fortunately, this was appeared to be of the distal most ureter.  As such, a new 6 x 24 Contour-type stent was placed over the remaining safety wire using fluoroscopic guidance.  Good  proximal and distal deployment were noted.  Bladder was emptied per cystoscope, procedure was then terminated.  The patient tolerated the procedure well.  There were no immediate periprocedural complications.  The patient was taken to the postanesthesia care unit in stable condition.          ______________________________ Alexis Frock, MD     TM/MEDQ  D:  11/09/2016  T:  11/09/2016  Job:  [id number]

## 2016-11-14 ENCOUNTER — Other Ambulatory Visit: Payer: Self-pay | Admitting: Urology

## 2016-11-15 ENCOUNTER — Encounter: Payer: Self-pay | Admitting: Pediatric Intensive Care

## 2016-11-22 NOTE — Congregational Nurse Program (Signed)
Congregational Nurse Program Note  Date of Encounter: 11/15/2016  Past Medical History: Past Medical History:  Diagnosis Date  . Headache(784.0)   . History of kidney stones 06/25/2016  . Hx of chlamydia infection   . Hx of ectopic pregnancy    09-19-2002  s/p  left salpingectomy  . Thrombocytosis ([place])     Encounter Details:     CNP Questionnaire - 11/15/16 1500      Patient Demographics   Is this a new or existing patient? New   Patient is considered a/an Immigrant   Race Latino/Hispanic     Patient Assistance   Location of Patient Assistance [employer]   Patient's financial/insurance status Self-Pay (Uninsured)   Uninsured Patient ([place] Card/Care Connects) Yes   Interventions Follow-up/Education/Support provided after completed appt.   Patient referred to apply for the following financial assistance [facility] insecurities addressed Not Applicable   Transportation assistance No   Assistance securing medications No   Educational health offerings Navigating the healthcare system     Encounter Details   Primary purpose of visit Education/Health Concerns;Post ED/Hospitalization Visit;Navigating the Healthcare System;Spiritual Care/Support Visit   Was an Emergency Department visit averted? Not Applicable   Does patient have a medical provider? Yes   Patient referred to Not Applicable   Was a mental health screening completed? (GAINS tool) No   Does patient have dental issues? No   Does patient have vision issues? No   Does your patient have an abnormal blood pressure today? No   Since previous encounter, have you referred patient for abnormal blood pressure that resulted in a new diagnosis or medication change? No   Does your patient have an abnormal blood glucose today? No   Since previous encounter, have you referred patient for abnormal blood glucose that resulted in a new diagnosis or medication change? No   Was there a life-saving intervention  made? No    Via interpreter Fredrich Birks- client states that she had a c-section in February and had complications following the delivery. She required stents for ureter and has had abdominal pain since then. Client states that she can't take the prescribed pain medicine as it interferes with her ability to care for her children. Her spouse is absent. She is tearful and states that she requires another surgery but doesn't have the funds to pay for that or her current hospital bills. Case worker Lyndal Pulley will intervene with urology clinic. CN will discuss case with L. Tonna Corner. CN reassured client that she will be taken care of and that [facility] staff/CN will continue to assist her as she needs.

## 2016-11-28 NOTE — Progress Notes (Signed)
Urine pregnancy test 11-09-16 epic  BMP 11-09-16 epic

## 2016-11-28 NOTE — Patient Instructions (Addendum)
Lauren [name]  11/28/2016                  Lauren [name]  11/29/2016   Your procedure is scheduled on: 12-07-16  Report to [facility] Main  Entrance Take [facility]  elevators to 3rd floor to  [place] at 1030AM.    Call this number if you have problems the morning of surgery [phone]    Remember: ONLY 1 PERSON MAY GO WITH YOU TO SHORT STAY TO GET  READY MORNING OF YOUR SURGERY.  Do not eat food After Midnight on Monday 12-05-16 . Drink plenty of clear liquids all day Tuesday 12-06-16 and follow all bowel prep instructions from your surgeon's office . Nothing by mouth after midnight on Tuesday!     Take these medicines the morning of surgery with A SIP OF WATER: tramadol as needed                                You may not have any metal on your body including hair pins and              piercings  Do not wear jewelry, make-up, lotions, powders or perfumes, deodorant             Do not wear nail polish.  Do not shave  48 hours prior to surgery.               Do not bring valuables to the hospital. [place].  Contacts, dentures or bridgework may not be worn into surgery.  Leave suitcase in the car. After surgery it may be brought to your room.               Please read over the following fact sheets you were given: _____________________________________________________________________   CLEAR LIQUID DIET   Foods Allowed                                                                     Foods Excluded  Coffee and tea, regular and decaf                             liquids that you cannot  Plain Jell-O in any flavor                                             see through such as: Fruit ices (not with fruit pulp)                                     milk, soups, orange juice  Iced Popsicles  All solid food Carbonated beverages, regular and diet                                     Cranberry, grape and apple juices Sports drinks like Gatorade Lightly seasoned clear broth or consume(fat free) Sugar, honey syrup  Sample Menu Breakfast                                Lunch                                     Supper Cranberry juice                    Beef broth                            Chicken broth Jell-O                                     Grape juice                           Apple juice Coffee or tea                        Jell-O                                      Popsicle                                                Coffee or tea                        Coffee or tea  _____________________________________________________________________  [facility] - Preparing for Surgery Before surgery, you can play an important role.  Because skin is not sterile, your skin needs to be as free of germs as possible.  You can reduce the number of germs on your skin by washing with CHG (chlorahexidine gluconate) soap before surgery.  CHG is an antiseptic cleaner which kills germs and bonds with the skin to continue killing germs even after washing. Please DO NOT use if you have an allergy to CHG or antibacterial soaps.  If your skin becomes reddened/irritated stop using the CHG and inform your nurse when you arrive at Short Stay. Do not shave (including legs and underarms) for at least 48 hours prior to the first CHG shower.  You may shave your face/neck. Please follow these instructions carefully:  1.  Shower with CHG Soap the night before surgery and the  morning of Surgery.  2.  If you choose to wash your hair, wash your hair first as usual with your  normal  shampoo.  3.  After you shampoo, rinse your hair and body thoroughly to remove the  shampoo.  4.  Use CHG as you would any other liquid soap.  You can apply chg directly  to the skin and wash                       Gently with a scrungie or clean washcloth.  5.  Apply  the CHG Soap to your body ONLY FROM THE NECK DOWN.   Do not use on face/ open                           Wound or open sores. Avoid contact with eyes, ears mouth and genitals (private parts).                       Wash face,  Genitals (private parts) with your normal soap.             6.  Wash thoroughly, paying special attention to the area where your surgery  will be performed.  7.  Thoroughly rinse your body with warm water from the neck down.  8.  DO NOT shower/wash with your normal soap after using and rinsing off  the CHG Soap.                9.  Pat yourself dry with a clean towel.            10.  Wear clean pajamas.            11.  Place clean sheets on your bed the night of your first shower and do not  sleep with pets. Day of Surgery : Do not apply any lotions/deodorants the morning of surgery.  Please wear clean clothes to the hospital/surgery center.  FAILURE TO FOLLOW THESE INSTRUCTIONS MAY RESULT IN THE CANCELLATION OF YOUR SURGERY PATIENT SIGNATURE_________________________________  NURSE SIGNATURE__________________________________  ________________________________________________________________________     Su procedimiento est programado para el: 12-07-16  Informe a la entrada principal del [facility] ascensores del Este hasta el tercer [place] a las 10:30 AM.   Llame a este nmero si tiene problemas la maana de la ciruga [phone]   Recuerde: SLO 1 PERSONA PUEDE IR CON USTED PARA QUEDARSE MANTENIDO PARA PREPARARSE PARA LA [place] DE SU [place].   No coma comida despus de la medianoche del lunes 12-05-16. Beba muchos lquidos claros [employer] 12-06-16 y siga todas las instrucciones de preparacin intestinal de la oficina de su cirujano. Nada por la boca despus de la medianoche del [place]!   Tome estos medicamentos la maana de la ciruga con un SORBO DE AGUA: tramadol segn sea necesario   Es posible que no  tenga ningn metal en su cuerpo, incluidas las horquillas y piercings No use joyas, maquillaje, lociones, polvos o perfumes, desodorante No use esmalte de uas. No se afeite 48 horas antes de la [place]. No traiga objetos de [employer] al hospital.   [place] NO ES RESPONSABLE DE VALORABLES. Los contactos, las dentaduras postizas o los puentes no se pueden usar en la [place]. Deje la [employer] auto. Despus de la [place], puede ser llevado a su habitacin. Lea las siguientes hojas informativas que le entregaron: _____________________________________________________________________   Annitta Needs CLARA    Alimentos permitidos        Alimentos excluidos Caf y t, lquidos regulares y descafeinados    que no puede  Plain Jell-O en cualquier sabor,  vea a travs de:  Helados de fruta (no con pulpa de fruta)     leche, sopas, jugo de naranja  [employer] comida slida  Bebidas carbonatadas, regulares y dietticas  Zumos de [place], [place] y [place] Bebidas  deportivas como Gatorade [place] claro ligeramente  sazonado o consumir (sin grasa) Location manager, Chrissie Noa de miel      Sample Menu Breakfast                                Lunch                                     Supper Cranberry juice                    Beef broth                            Chicken broth Jell-O                                     Grape juice                           Apple juice Coffee or tea                        Jell-O                                      Popsicle                                                Coffee or tea                        Coffee or tea  _____________________________________________________________________  [facility] - Preparacin para la ciruga Antes de la [place], puedes jugar un papel importante. Debido a que la piel no es estril, su piel debe estar lo ms libre de grmenes posible. Puede reducir la cantidad de grmenes en su piel lavndola con jabn CHG (gluconato de  clorahexidina) antes de la cir[place]. CHG es un limpiador antisptico que [employer] grmenes y se adhiere a la piel para Electronics engineer los grmenes incluso despus del lavado. NO lo use si es alrgico al CHG o jabones antibacterianos. Si su piel se enrojece / irrita, deje de usar CHG e informe a su enfermera cuando llegue a Short Stay. No se afeite (incluidas las piernas y las axilas) durante al menos 48 horas antes de la primera ducha de CHG. Puede afeitarse la cara / el cuello. Por favor, siga estas instrucciones cuidadosamente: 1. Dchese con jabn CHG la noche antes de la [place] y la maana de la [place]. 2. Si decide lavarse el cabello, lave su cabello primero como de costumbre con su champ normal. 3. Despus de champ, enjuague bien el cabello y el cuerpo para eliminar el champ.  4. Use CHG como lo hara con cualquier otro jabn lquido. Puede aplicar chg directamente a la piel y lavar                      Suavemente con un scrungie o pao limpio. 5. Aplique el jabn CHG a su cuerpo SLO DESDE EL CUELLO HACIA ABAJO. No usar en la cara / abrir                      Heridas o llagas abiertas. Evite el contacto con los ojos, los odos, la boca y los genitales (partes privadas).                      Lave la cara, genitales (partes privadas) con su jabn normal.            6. Lvese a fondo, prestando especial atencin al rea donde se realizar la [place]. 7. Enjuague bien su cuerpo con agua tibia desde el cuello hacia abajo. 8. NO se bae / lave con su jabn normal despus de usar y enjuague el jabn CHG.                9. Patese con una toalla limpia.            10. Use pijamas limpios.            11. Coloque sbanas limpias en su cama la noche de su primera ducha y no se acueste con las mascotas. Da de Dwaine Gale: No aplique ninguna locin / desodorantes la maana de la [place]. Por favor use ropa limpia en el hospital / centro de ciruga.  EL NO SEGUIR ESTAS INSTRUCCIONES PUEDE RESULTAR EN  LA CANCELACIN DE SU CIRUGA  PATIENT SIGNATURE_________________________________  NURSE SIGNATURE__________________________________  ________________________________________________________________________

## 2016-11-29 ENCOUNTER — Encounter (HOSPITAL_COMMUNITY)
Admission: RE | Admit: 2016-11-29 | Discharge: 2016-11-29 | Disposition: A | Discharge status: Home / Self Care | Payer: Self-pay | Source: Ambulatory Visit | Attending: Anesthesiology | Admitting: Anesthesiology

## 2016-11-29 ENCOUNTER — Ambulatory Visit (HOSPITAL_COMMUNITY)
Admission: RE | Admit: 2016-11-29 | Discharge: 2016-11-29 | Disposition: A | Discharge status: Home / Self Care | Payer: Self-pay | Source: Ambulatory Visit | Attending: Anesthesiology | Admitting: Anesthesiology

## 2016-11-29 ENCOUNTER — Encounter (HOSPITAL_COMMUNITY): Payer: Self-pay

## 2016-11-29 DIAGNOSIS — R058 Other specified cough: Secondary | ICD-10-CM

## 2016-11-29 DIAGNOSIS — J029 Acute pharyngitis, unspecified: Secondary | ICD-10-CM | POA: Insufficient documentation

## 2016-11-29 DIAGNOSIS — R05 Cough: Secondary | ICD-10-CM | POA: Insufficient documentation

## 2016-11-29 DIAGNOSIS — R918 Other nonspecific abnormal finding of lung field: Secondary | ICD-10-CM | POA: Insufficient documentation

## 2016-11-29 DIAGNOSIS — R509 Fever, unspecified: Secondary | ICD-10-CM

## 2016-11-29 LAB — BASIC METABOLIC PANEL
Anion gap: 10 (ref 5–15)
BUN: 17 mg/dL (ref 6–20)
CALCIUM: 9.4 mg/dL (ref 8.9–10.3)
CO2: 29 mmol/L (ref 22–32)
CREATININE: 0.61 mg/dL (ref 0.44–1.00)
Chloride: 104 mmol/L (ref 101–111)
GFR calc Af Amer: >60 mL/min (ref >60)
Glucose, Bld: 119 mg/dL — ABNORMAL HIGH (ref 65–99)
POTASSIUM: 4.1 mmol/L (ref 3.5–5.1)
SODIUM: 143 mmol/L (ref 135–145)

## 2016-11-29 LAB — CBC
HCT: 35.2 % — ABNORMAL LOW (ref 36.0–46.0)
Hemoglobin: 11.4 g/dL — ABNORMAL LOW (ref 12.0–15.0)
MCH: 27.7 pg (ref 26.0–34.0)
MCHC: 32.4 g/dL (ref 30.0–36.0)
MCV: 85.6 fL (ref 78.0–100.0)
PLATELETS: 510 10*3/uL — AB (ref 150–400)
RBC: 4.11 MIL/uL (ref 3.87–5.11)
RDW: 13.9 % (ref 11.5–15.5)
WBC: 10.6 10*3/uL — AB (ref 4.0–10.5)

## 2016-11-29 LAB — HCG, SERUM, QUALITATIVE: PREG SERUM: NEGATIVE

## 2016-11-29 NOTE — Progress Notes (Addendum)
Pt presented to PST appt today c/o sore throat, cough with yellow sputum for 2 days, and oral temp of 99.1. Pt deneis any other sx. RN to have patient pre-op blood work and additional chest xray and to F/U with her PCP and surgeon especially if symptoms worsen

## 2016-11-30 NOTE — Progress Notes (Signed)
RN called [facility] and [facility] for [place] to notify Dr Tresa Moore that patient  Had 99.1 temp, sore throat and cough x2 days, and that RN had routed results of labs and CXR to [place] pod number.

## 2016-11-30 NOTE — Progress Notes (Signed)
CBC, and CXR result routed via epic to [facility] urology POD

## 2016-12-07 ENCOUNTER — Encounter (HOSPITAL_COMMUNITY): Payer: Self-pay | Admitting: *Deleted

## 2016-12-07 ENCOUNTER — Inpatient Hospital Stay (HOSPITAL_COMMUNITY): Payer: Self-pay | Admitting: Registered Nurse

## 2016-12-07 ENCOUNTER — Inpatient Hospital Stay (HOSPITAL_COMMUNITY)
Admission: RE | Admit: 2016-12-07 | Discharge: 2016-12-09 | DRG: 660 | Disposition: A | Discharge status: Home / Self Care | Payer: Self-pay | Source: Ambulatory Visit | Attending: Urology | Admitting: Urology

## 2016-12-07 ENCOUNTER — Encounter (HOSPITAL_COMMUNITY)
Admission: RE | Disposition: A | Discharge status: Home / Self Care | Payer: Self-pay | Source: Ambulatory Visit | Attending: Urology

## 2016-12-07 DIAGNOSIS — Z98891 History of uterine scar from previous surgery: Secondary | ICD-10-CM

## 2016-12-07 DIAGNOSIS — N131 Hydronephrosis with ureteral stricture, not elsewhere classified: Principal | ICD-10-CM | POA: Diagnosis present

## 2016-12-07 DIAGNOSIS — N135 Crossing vessel and stricture of ureter without hydronephrosis: Secondary | ICD-10-CM | POA: Diagnosis present

## 2016-12-07 LAB — TYPE AND SCREEN
ABO/RH(D): B POS
ABO/RH(D): B POS
Antibody Screen: NEGATIVE
Antibody Screen: NEGATIVE

## 2016-12-07 LAB — HEMOGLOBIN AND HEMATOCRIT, BLOOD
HEMATOCRIT: 39.9 % (ref 36.0–46.0)
HEMOGLOBIN: 13.1 g/dL (ref 12.0–15.0)

## 2016-12-07 SURGERY — REIMPLANTATION, URETER, ROBOT-ASSISTED, LAPAROSCOPIC
Anesthesia: General | Laterality: Left

## 2016-12-07 MED ORDER — DEXAMETHASONE SODIUM PHOSPHATE 10 MG/ML IJ SOLN
INTRAMUSCULAR | Status: AC
  Filled 2016-12-07: qty 1

## 2016-12-07 MED ORDER — OXYCODONE HCL 5 MG PO TABS
5.0000 mg | ORAL_TABLET | ORAL | Status: DC | PRN
  Administered 2016-12-07 – 2016-12-09 (×4): 5 mg via ORAL
  Filled 2016-12-07 (×4): qty 1

## 2016-12-07 MED ORDER — DEXTROSE-NACL 5-0.45 % IV SOLN
INTRAVENOUS | Status: DC
  Administered 2016-12-07 – 2016-12-08 (×2): via INTRAVENOUS

## 2016-12-07 MED ORDER — LIDOCAINE 2% (20 MG/ML) 5 ML SYRINGE
INTRAMUSCULAR | Status: AC
  Filled 2016-12-07: qty 5

## 2016-12-07 MED ORDER — DIPHENHYDRAMINE HCL 50 MG/ML IJ SOLN: 12.5000 mg | Freq: Four times a day (QID) | INTRAMUSCULAR | Status: DC | PRN

## 2016-12-07 MED ORDER — ROCURONIUM BROMIDE 50 MG/5ML IV SOSY
PREFILLED_SYRINGE | INTRAVENOUS | Status: AC
  Filled 2016-12-07: qty 5

## 2016-12-07 MED ORDER — HYDROMORPHONE HCL-NACL 0.5-0.9 MG/ML-% IV SOSY
0.2500 mg | PREFILLED_SYRINGE | INTRAVENOUS | Status: DC | PRN
  Administered 2016-12-07 (×2): 0.5 mg via INTRAVENOUS

## 2016-12-07 MED ORDER — PROPOFOL 10 MG/ML IV BOLUS
INTRAVENOUS | Status: DC | PRN
  Administered 2016-12-07: 170 mg via INTRAVENOUS

## 2016-12-07 MED ORDER — LIDOCAINE 2% (20 MG/ML) 5 ML SYRINGE
INTRAMUSCULAR | Status: DC | PRN
  Administered 2016-12-07: 100 mg via INTRAVENOUS

## 2016-12-07 MED ORDER — SUGAMMADEX SODIUM 200 MG/2ML IV SOLN
INTRAVENOUS | Status: AC
  Filled 2016-12-07: qty 2

## 2016-12-07 MED ORDER — ONDANSETRON HCL 4 MG/2ML IJ SOLN
INTRAMUSCULAR | Status: DC | PRN
  Administered 2016-12-07: 4 mg via INTRAVENOUS

## 2016-12-07 MED ORDER — ONDANSETRON HCL 4 MG/2ML IJ SOLN
INTRAMUSCULAR | Status: AC
  Filled 2016-12-07: qty 2

## 2016-12-07 MED ORDER — HYDROMORPHONE HCL-NACL 0.5-0.9 MG/ML-% IV SOSY
0.5000 mg | PREFILLED_SYRINGE | INTRAVENOUS | Status: DC | PRN
  Administered 2016-12-07: 1 mg via INTRAVENOUS
  Administered 2016-12-08 (×2): 0.5 mg via INTRAVENOUS
  Filled 2016-12-07 (×2): qty 1
  Filled 2016-12-07: qty 2

## 2016-12-07 MED ORDER — CEFAZOLIN SODIUM-DEXTROSE 2-4 GM/100ML-% IV SOLN
2.0000 g | INTRAVENOUS | Status: AC
  Administered 2016-12-07: 2 g via INTRAVENOUS
  Filled 2016-12-07: qty 100

## 2016-12-07 MED ORDER — STERILE WATER FOR IRRIGATION IR SOLN
Status: DC | PRN
  Administered 2016-12-07: 1000 mL

## 2016-12-07 MED ORDER — BUPIVACAINE LIPOSOME 1.3 % IJ SUSP
INTRAMUSCULAR | Status: DC | PRN
  Administered 2016-12-07: 20 mL

## 2016-12-07 MED ORDER — SUGAMMADEX SODIUM 200 MG/2ML IV SOLN
INTRAVENOUS | Status: DC | PRN
  Administered 2016-12-07: 175 mg via INTRAVENOUS

## 2016-12-07 MED ORDER — ROCURONIUM BROMIDE 10 MG/ML (PF) SYRINGE
PREFILLED_SYRINGE | INTRAVENOUS | Status: DC | PRN
  Administered 2016-12-07 (×2): 10 mg via INTRAVENOUS
  Administered 2016-12-07: 50 mg via INTRAVENOUS
  Administered 2016-12-07 (×2): 10 mg via INTRAVENOUS

## 2016-12-07 MED ORDER — HYDROCODONE-ACETAMINOPHEN 5-325 MG PO TABS: 1.0000 | ORAL_TABLET | Freq: Four times a day (QID) | ORAL | 0 refills | Status: DC | PRN

## 2016-12-07 MED ORDER — PROMETHAZINE HCL 25 MG/ML IJ SOLN: 6.2500 mg | INTRAMUSCULAR | Status: DC | PRN

## 2016-12-07 MED ORDER — DEXAMETHASONE SODIUM PHOSPHATE 10 MG/ML IJ SOLN
INTRAMUSCULAR | Status: DC | PRN
  Administered 2016-12-07: 10 mg via INTRAVENOUS

## 2016-12-07 MED ORDER — HYDROMORPHONE HCL-NACL 0.5-0.9 MG/ML-% IV SOSY
PREFILLED_SYRINGE | INTRAVENOUS | Status: AC
  Filled 2016-12-07: qty 2

## 2016-12-07 MED ORDER — SCOPOLAMINE 1 MG/3DAYS TD PT72
MEDICATED_PATCH | TRANSDERMAL | Status: AC
  Filled 2016-12-07: qty 1

## 2016-12-07 MED ORDER — HYDROMORPHONE HCL-NACL 0.5-0.9 MG/ML-% IV SOSY
PREFILLED_SYRINGE | INTRAVENOUS | Status: AC
  Administered 2016-12-07: 0.5 mg via INTRAVENOUS
  Filled 2016-12-07: qty 2

## 2016-12-07 MED ORDER — ONDANSETRON HCL 4 MG/2ML IJ SOLN: 4.0000 mg | INTRAMUSCULAR | Status: DC | PRN

## 2016-12-07 MED ORDER — ACETAMINOPHEN 500 MG PO TABS
1000.0000 mg | ORAL_TABLET | Freq: Four times a day (QID) | ORAL | Status: AC
  Administered 2016-12-08 (×4): 1000 mg via ORAL
  Filled 2016-12-07 (×4): qty 2

## 2016-12-07 MED ORDER — FENTANYL CITRATE (PF) 250 MCG/5ML IJ SOLN
INTRAMUSCULAR | Status: AC
  Filled 2016-12-07: qty 5

## 2016-12-07 MED ORDER — SODIUM CHLORIDE 0.9 % IJ SOLN
INTRAMUSCULAR | Status: DC | PRN
  Administered 2016-12-07: 20 mL

## 2016-12-07 MED ORDER — SCOPOLAMINE 1 MG/3DAYS TD PT72
MEDICATED_PATCH | TRANSDERMAL | Status: DC | PRN
  Administered 2016-12-07: 1 via TRANSDERMAL

## 2016-12-07 MED ORDER — FENTANYL CITRATE (PF) 100 MCG/2ML IJ SOLN
INTRAMUSCULAR | Status: DC | PRN
  Administered 2016-12-07 (×7): 50 ug via INTRAVENOUS

## 2016-12-07 MED ORDER — MIDAZOLAM HCL 2 MG/2ML IJ SOLN
INTRAMUSCULAR | Status: AC
  Filled 2016-12-07: qty 2

## 2016-12-07 MED ORDER — LACTATED RINGERS IR SOLN
Status: DC | PRN
  Administered 2016-12-07: 1000 mL

## 2016-12-07 MED ORDER — SODIUM CHLORIDE 0.9 % IJ SOLN
INTRAMUSCULAR | Status: AC
  Filled 2016-12-07: qty 20

## 2016-12-07 MED ORDER — PROPOFOL 10 MG/ML IV BOLUS
INTRAVENOUS | Status: AC
  Filled 2016-12-07: qty 20

## 2016-12-07 MED ORDER — FENTANYL CITRATE (PF) 100 MCG/2ML IJ SOLN
INTRAMUSCULAR | Status: AC
  Filled 2016-12-07: qty 2

## 2016-12-07 MED ORDER — BUPIVACAINE LIPOSOME 1.3 % IJ SUSP
20.0000 mL | Freq: Once | INTRAMUSCULAR | Status: DC
  Filled 2016-12-07: qty 20

## 2016-12-07 MED ORDER — MIDAZOLAM HCL 5 MG/5ML IJ SOLN
INTRAMUSCULAR | Status: DC | PRN
  Administered 2016-12-07: 2 mg via INTRAVENOUS

## 2016-12-07 MED ORDER — LACTATED RINGERS IV SOLN
INTRAVENOUS | Status: DC
  Administered 2016-12-07: 11:00:00 via INTRAVENOUS

## 2016-12-07 MED ORDER — DIPHENHYDRAMINE HCL 12.5 MG/5ML PO ELIX: 12.5000 mg | ORAL_SOLUTION | Freq: Four times a day (QID) | ORAL | Status: DC | PRN

## 2016-12-07 SURGICAL SUPPLY — 87 items
APPLICATOR COTTON TIP 6IN STRL (MISCELLANEOUS) ×3 IMPLANT
APPLICATOR SURGIFLO ENDO (HEMOSTASIS) IMPLANT
BLADE SURG SZ10 CARB STEEL (BLADE) ×3 IMPLANT
CATH FOLEY 2WAY SLVR  5CC 20FR (CATHETERS) ×2
CATH FOLEY 2WAY SLVR 5CC 20FR (CATHETERS) ×1 IMPLANT
CATH FOLEY 3WAY  5CC 18FR (CATHETERS)
CATH FOLEY 3WAY 5CC 18FR (CATHETERS) IMPLANT
CHLORAPREP W/TINT 26ML (MISCELLANEOUS) ×3 IMPLANT
CLIP LIGATING HEMO LOK XL GOLD (MISCELLANEOUS) ×3 IMPLANT
CLIP VESOLOCK LG 6/CT PURPLE (CLIP) ×6 IMPLANT
COVER SURGICAL LIGHT HANDLE (MISCELLANEOUS) ×3 IMPLANT
COVER TIP SHEARS 8 DVNC (MISCELLANEOUS) ×1 IMPLANT
COVER TIP SHEARS 8MM DA VINCI (MISCELLANEOUS) ×2
DECANTER SPIKE VIAL GLASS SM (MISCELLANEOUS) IMPLANT
DERMABOND ADVANCED (GAUZE/BANDAGES/DRESSINGS) ×2
DERMABOND ADVANCED .7 DNX12 (GAUZE/BANDAGES/DRESSINGS) ×1 IMPLANT
DRAIN CHANNEL 15F RND FF 3/16 (WOUND CARE) IMPLANT
DRAPE ARM DVNC X/XI (DISPOSABLE) ×4 IMPLANT
DRAPE COLUMN DVNC XI (DISPOSABLE) ×1 IMPLANT
DRAPE DA VINCI XI ARM (DISPOSABLE) ×8
DRAPE DA VINCI XI COLUMN (DISPOSABLE) ×2
DRSG TEGADERM 4X4.75 (GAUZE/BANDAGES/DRESSINGS) ×3 IMPLANT
ELECT REM PT RETURN 15FT ADLT (MISCELLANEOUS) ×3 IMPLANT
GAUZE SPONGE 4X4 12PLY STRL (GAUZE/BANDAGES/DRESSINGS) ×3 IMPLANT
GLOVE BIO SURGEON STRL SZ 6.5 (GLOVE) ×6 IMPLANT
GLOVE BIO SURGEONS STRL SZ 6.5 (GLOVE) ×3
GLOVE BIOGEL M STRL SZ7.5 (GLOVE) ×9 IMPLANT
GOWN STRL REUS W/TWL LRG LVL3 (GOWN DISPOSABLE) ×6 IMPLANT
HOLDER FOLEY CATH W/STRAP (MISCELLANEOUS) IMPLANT
IRRIG SUCT STRYKERFLOW 2 WTIP (MISCELLANEOUS) ×3
IRRIGATION SUCT STRKRFLW 2 WTP (MISCELLANEOUS) ×1 IMPLANT
LOOP VESSEL MAXI BLUE (MISCELLANEOUS) ×3 IMPLANT
NDL SAFETY ECLIPSE 18X1.5 (NEEDLE) ×1 IMPLANT
NEEDLE HYPO 18GX1.5 SHARP (NEEDLE) ×2
NEEDLE INSUFFLATION 14GA 120MM (NEEDLE) ×3 IMPLANT
NS IRRIG 1000ML POUR BTL (IV SOLUTION) ×3 IMPLANT
PACK ROBOT UROLOGY CUSTOM (CUSTOM PROCEDURE TRAY) ×3 IMPLANT
PAD POSITIONING PINK XL (MISCELLANEOUS) ×3 IMPLANT
PLUG CATH AND CAP STER (CATHETERS) ×3 IMPLANT
PORT ACCESS TROCAR AIRSEAL 12 (TROCAR) ×1 IMPLANT
PORT ACCESS TROCAR AIRSEAL 5M (TROCAR) ×2
POUCH SPECIMEN RETRIEVAL 10MM (ENDOMECHANICALS) IMPLANT
SEAL CANN UNIV 5-8 DVNC XI (MISCELLANEOUS) ×4 IMPLANT
SEAL XI 5MM-8MM UNIVERSAL (MISCELLANEOUS) ×8
SET TRI-LUMEN FLTR TB AIRSEAL (TUBING) ×3 IMPLANT
SHEET LAVH (DRAPES) ×3 IMPLANT
SOLUTION ELECTROLUBE (MISCELLANEOUS) ×3 IMPLANT
SPONGE LAP 18X18 X RAY DECT (DISPOSABLE) ×3 IMPLANT
STENT CONTOUR 7FRX24X.038 (STENTS) IMPLANT
SURGIFLO W/THROMBIN 8M KIT (HEMOSTASIS) IMPLANT
SUT ETHILON 3 0 PS 1 (SUTURE) ×3 IMPLANT
SUT MNCRL 3 0 VIOLET RB1 (SUTURE) ×3 IMPLANT
SUT MNCRL AB 4-0 PS2 18 (SUTURE) ×6 IMPLANT
SUT MON AB 2-0 SH 27 (SUTURE)
SUT MON AB 2-0 SH27 (SUTURE) IMPLANT
SUT MONOCRYL 3 0 RB1 (SUTURE) ×6
SUT PDS AB 1 CTX 36 (SUTURE) IMPLANT
SUT PDS AB 2-0 CT2 27 (SUTURE) ×6 IMPLANT
SUT PDS AB 3-0 SH 27 (SUTURE) IMPLANT
SUT PDS AB 4-0 RB1 27 (SUTURE) IMPLANT
SUT PDS AB 4-0 SH 27 (SUTURE) IMPLANT
SUT PROLENE 2 0 SH DA (SUTURE) IMPLANT
SUT SILK 0 (SUTURE)
SUT SILK 0 30XBRD TIE 6 (SUTURE) IMPLANT
SUT SILK 2 0 (SUTURE)
SUT SILK 2 0 SH CR/8 (SUTURE) IMPLANT
SUT SILK 2-0 30XBRD TIE 12 (SUTURE) IMPLANT
SUT SILK 3 0 (SUTURE)
SUT SILK 3 0 12 30 (SUTURE) IMPLANT
SUT SILK 3-0 18XBRD TIE 12 (SUTURE) IMPLANT
SUT VIC AB 0 CT1 27 (SUTURE) ×2
SUT VIC AB 0 CT1 27XBRD ANTBC (SUTURE) ×1 IMPLANT
SUT VIC AB 2-0 SH 27 (SUTURE)
SUT VIC AB 2-0 SH 27X BRD (SUTURE) IMPLANT
SUT VIC AB 3-0 SH 27 (SUTURE)
SUT VIC AB 3-0 SH 27X BRD (SUTURE) IMPLANT
SUT VIC AB 4-0 RB1 27 (SUTURE)
SUT VIC AB 4-0 RB1 27XBRD (SUTURE) IMPLANT
SUT VIC AB 4-0 SH 18 (SUTURE) IMPLANT
SUT VICRYL 0 UR6 27IN ABS (SUTURE) ×6 IMPLANT
SUT VLOC BARB 180 ABS3/0GR12 (SUTURE) ×3
SUTURE VLOC BRB 180 ABS3/0GR12 (SUTURE) ×1 IMPLANT
TROCAR BLADELESS OPT 5 100 (ENDOMECHANICALS) ×3 IMPLANT
TROCAR XCEL 12X100 BLDLESS (ENDOMECHANICALS) ×3 IMPLANT
URINEMETER 200ML W/220 (MISCELLANEOUS) IMPLANT
WATER STERILE IRR 1000ML POUR (IV SOLUTION) ×3 IMPLANT
YANKAUER SUCT BULB TIP 10FT TU (MISCELLANEOUS) IMPLANT

## 2016-12-07 NOTE — Progress Notes (Signed)
Patient has refused blood products in the past and continues to do so.

## 2016-12-07 NOTE — H&P (Signed)
Lauren [name] is an 41 y.o. female.    Chief Complaint:    HPI:   1 - Left Ureterovaginal Fistula / Flank Pain / Urinoma / Stricture - G4P4 s/p cesarean on 06/08/16 by P. Constant MD with resultant left ureteral injury / VVF / Urinoma. Cysto / retrograde / 6/24 contour stent 06/2016 confirms stricure / fistula approx 3cm proximal to UVJ.   Follow up diagnostic ureteroscopy 10/2016 showed complete resolution of fistula but new high grade short segment stricture just above intramural ureter.   PMH sig for lap chole, cesarean x1. Spanish only. She denies CV disease / blood thinners / baseline medications.   Today "[place]" is seen to proceed with LEFT ureteral reimplant for high grade stricture.   Past Medical History:  Diagnosis Date  . Headache(784.0)   . History of kidney stones 06/25/2016  . Hx of chlamydia infection   . Hx of ectopic pregnancy    09-19-2002  s/p  left salpingectomy  . Thrombocytosis ([place])     Past Surgical History:  Procedure Laterality Date  . APPENDECTOMY    . CESAREAN SECTION N/A 06/08/2016   Procedure: CESAREAN SECTION;  Surgeon: Mora Bellman, MD;  Location: [place];  Service: Obstetrics;  Laterality: N/A;  . CHOLECYSTECTOMY N/A 10/19/2012   Procedure: LAPAROSCOPIC CHOLECYSTECTOMY WITH INTRAOPERATIVE CHOLANGIOGRAM;  Surgeon: Shann Medal, MD;  Location: [place];  Service: General;  Laterality: N/A;  . CYSTOSCOPY W/ URETERAL STENT PLACEMENT Left 06/25/2016   Procedure: CYSTOSCOPY WITH RETROGRADE PYELOGRAM/LEFT URETERAL STENT PLACEMENT;  Surgeon: Alexis Frock, MD;  Location: [facility];  Service: Urology;  Laterality: Left;  . CYSTOSCOPY WITH RETROGRADE PYELOGRAM, URETEROSCOPY AND STENT PLACEMENT Left 11/09/2016   Procedure: CYSTOSCOPY WITH RETROGRADE PYELOGRAM, DIAGNOSTIC URETEROSCOPY AND STENT REPLACEMENT;  Surgeon: Alexis Frock, MD;  Location[facility];  Service: Urology;  Laterality: Left;  . LAPROSCOPY W/ LEFT  SALPINGECTOMY FOR ECTOPIC   09/19/2002    Family History  Problem Relation Age of Onset  . Seizures Son   . Hypertension Son    Social History:  reports that she has never smoked. She has never used smokeless tobacco. She reports that she does not drink alcohol or use drugs.  Allergies: No Known Allergies  No prescriptions prior to admission.    No results found for this or any previous visit (from the past 48 hour(s)). No results found.  Review of Systems  Constitutional: Negative.   HENT: Negative.   Eyes: Negative.   Respiratory: Negative.   Cardiovascular: Negative.   Gastrointestinal: Negative.   Genitourinary: Positive for dysuria.  Musculoskeletal: Negative.   Skin: Negative.   Neurological: Negative.   Endo/Heme/Allergies: Negative.   Psychiatric/Behavioral: Negative.     Last menstrual period 11/18/2016, not currently breastfeeding. Physical Exam  Constitutional: She appears well-developed.  Eyes: Pupils are equal, round, and reactive to light.  Neck: Normal range of motion.  Cardiovascular: Normal rate.   Respiratory: Effort normal.  GI: Soft.  Genitourinary:  Genitourinary Comments: Mild left CVAT  Musculoskeletal: Normal range of motion.  Neurological: She is alert.  Skin: Skin is warm.  Psychiatric: She has a normal mood and affect.     Assessment/Plan  Proceed as planned with LEFT robotic ureteral reimplant. Risks, benefits, alternatives, expected peri-op course discussed previously and reiterated today.   Alexis Frock, MD 12/07/2016, 8:01 AM

## 2016-12-07 NOTE — Transfer of Care (Signed)
Immediate Anesthesia Transfer of Care Note  Patient: Lauren [name]  Procedure(s) Performed: Procedure(s): XI ROBOTICALLY ASSISTED LAPAROSCOPIC URETERAL RE-IMPLANTATION (Left)  Patient Location: PACU  Anesthesia Type:General  Level of Consciousness: awake, alert  and oriented  Airway & Oxygen Therapy: Patient Spontanous Breathing and Patient connected to face mask oxygen  Post-op Assessment: Report given to RN and Post -op Vital signs reviewed and stable  Post vital signs: Reviewed and stable  Last Vitals:  Vitals:   12/07/16 1014 12/07/16 1712  BP: 122/73 122/74  Pulse: 75 67  Resp: 18 15  Temp: 36.8 C 37.1 C  SpO2: 100% 100%    Last Pain:  Vitals:   12/07/16 1014  TempSrc: Oral         Complications: No apparent anesthesia complications

## 2016-12-07 NOTE — Brief Op Note (Signed)
12/07/2016  4:52 PM  PATIENT:  Lauren [name]  41 y.o. female  PRE-OPERATIVE DIAGNOSIS:  LEFT URETERAL STRICTURE  POST-OPERATIVE DIAGNOSIS:  LEFT URETERAL STRICTURE  PROCEDURE:  Procedure(s): XI ROBOTICALLY ASSISTED LAPAROSCOPIC URETERAL RE-IMPLANTATION (Left)  SURGEON:  Surgeon(s) and Role:    * Alexis Frock, MD - Primary  PHYSICIAN ASSISTANT:   ASSISTANTS: Debbrah Alar PA   ANESTHESIA:   general  EBL:  Total I/O In: 1000 [I.V.:1000] Out: 50 [Blood:50]  BLOOD ADMINISTERED:none  DRAINS: 1 - JP to bulb; 2 - Foley to gravity   LOCAL MEDICATIONS USED:  MARCAINE     SPECIMEN:  No Specimen  DISPOSITION OF SPECIMEN:  N/A  COUNTS:  YES  TOURNIQUET:  * No tourniquets in log *  DICTATION: .Other Dictation: Dictation Number [id number]  PLAN OF CARE: Admit to inpatient   PATIENT DISPOSITION:  PACU - hemodynamically stable.   Delay start of Pharmacological VTE agent (>24hrs) due to surgical blood loss or risk of bleeding: yes

## 2016-12-07 NOTE — Anesthesia Postprocedure Evaluation (Signed)
Anesthesia Post Note  Patient: Lauren [name]  Procedure(s) Performed: Procedure(s) (LRB): XI ROBOTICALLY ASSISTED LAPAROSCOPIC URETERAL RE-IMPLANTATION (Left)     Patient location during evaluation: PACU Anesthesia Type: General Level of consciousness: awake and alert Pain management: pain level controlled Vital Signs Assessment: post-procedure vital signs reviewed and stable Respiratory status: spontaneous breathing, nonlabored ventilation, respiratory function stable and patient connected to nasal cannula oxygen Cardiovascular status: blood pressure returned to baseline and stable Postop Assessment: no signs of nausea or vomiting Anesthetic complications: no    Last Vitals:  Vitals:   12/07/16 1800 12/07/16 1821  BP: 128/78 108/60  Pulse: 68 (!) 58  Resp: 14 12  Temp: 37.1 C 36.8 C  SpO2: 100% 100%    Last Pain:  Vitals:   12/07/16 1825  TempSrc:   PainSc: Tyler Deis

## 2016-12-07 NOTE — Anesthesia Preprocedure Evaluation (Addendum)
Anesthesia Evaluation  Patient identified by MRN, date of birth, ID band Patient awake    Reviewed: Allergy & Precautions, NPO status , Patient's Chart, lab work & pertinent test results  Airway Mallampati: I   Neck ROM: Full    Dental no notable dental hx.    Pulmonary neg pulmonary ROS,    breath sounds clear to auscultation       Cardiovascular negative cardio ROS   Rhythm:Regular Rate:Normal     Neuro/Psych  Headaches, negative neurological ROS  negative psych ROS   GI/Hepatic negative GI ROS, Neg liver ROS,   Endo/Other  negative endocrine ROS  Renal/GU negative Renal ROS   vesicovaginal fistula negative genitourinary   Musculoskeletal negative musculoskeletal ROS (+)   Abdominal   Peds negative pediatric ROS (+)  Hematology negative hematology ROS (+)   Anesthesia Other Findings   Reproductive/Obstetrics negative OB ROS                           Anesthesia Physical Anesthesia Plan  ASA: I  Anesthesia Plan: General   Post-op Pain Management:    Induction: Intravenous  PONV Risk Score and Plan: 4 or greater and Ondansetron, Dexamethasone, Scopolamine patch - Pre-op and Treatment may vary due to age or medical condition  Airway Management Planned: Oral ETT  Additional Equipment:   Intra-op Plan:   Post-operative Plan: Extubation in OR  Informed Consent: I have reviewed the patients History and Physical, chart, labs and discussed the procedure including the risks, benefits and alternatives for the proposed anesthesia with the patient or authorized representative who has indicated his/her understanding and acceptance.   Dental advisory given  Plan Discussed with: CRNA  Anesthesia Plan Comments:         Anesthesia Quick Evaluation

## 2016-12-07 NOTE — Discharge Instructions (Signed)

## 2016-12-07 NOTE — Anesthesia Procedure Notes (Signed)
Procedure Name: Intubation Date/Time: 12/07/2016 1:58 PM Performed by: Carleene Cooper A Pre-anesthesia Checklist: Patient identified, Emergency Drugs available, Suction available, Timeout performed and Patient being monitored Patient Re-evaluated:Patient Re-evaluated prior to induction Oxygen Delivery Method: Circle system utilized Preoxygenation: Pre-oxygenation with 100% oxygen Induction Type: IV induction Ventilation: Mask ventilation without difficulty Laryngoscope Size: Mac and 4 Grade View: Grade I Tube type: Oral Tube size: 7.5 mm Number of attempts: 1 Airway Equipment and Method: Stylet Placement Confirmation: ETT inserted through vocal cords under direct vision,  positive ETCO2 and breath sounds checked- equal and bilateral Secured at: 21 cm Tube secured with: Tape Dental Injury: Teeth and Oropharynx as per pre-operative assessment

## 2016-12-07 NOTE — Progress Notes (Signed)
Patient arrived to unit.  Patient stable at this time with family at bedside.  Patient and family oriented to unit and equipment.  Will continue to monitor.

## 2016-12-07 NOTE — Interval H&P Note (Signed)
History and Physical Interval Note:  12/07/2016 1:40 PM  Lauren [name]  has presented today for surgery, with the diagnosis of LEFT URETERAL STRICTURE  The various methods of treatment have been discussed with the patient and family. After consideration of risks, benefits and other options for treatment, the patient has consented to  Procedure(s): XI ROBOTICALLY ASSISTED LAPAROSCOPIC URETERAL RE-IMPLANTATION (Left) as a surgical intervention .  The patient's history has been reviewed, patient examined, no change in status, stable for surgery.  I have reviewed the patient's chart and labs.  Questions were answered to the patient's satisfaction.     Discussed with Spanish interpreter. Also clarified that patient WILL ACCEPT blood products if needed in emergency.   Bijan Ridgley

## 2016-12-08 ENCOUNTER — Encounter (HOSPITAL_COMMUNITY): Payer: Self-pay

## 2016-12-08 LAB — BASIC METABOLIC PANEL
Anion gap: 10 (ref 5–15)
BUN: 9 mg/dL (ref 6–20)
CALCIUM: 8.8 mg/dL — AB (ref 8.9–10.3)
CO2: 23 mmol/L (ref 22–32)
CREATININE: 0.62 mg/dL (ref 0.44–1.00)
Chloride: 104 mmol/L (ref 101–111)
GFR calc non Af Amer: >60 mL/min (ref >60)
GLUCOSE: 173 mg/dL — AB (ref 65–99)
Potassium: 3.8 mmol/L (ref 3.5–5.1)
Sodium: 137 mmol/L (ref 135–145)

## 2016-12-08 LAB — HEMOGLOBIN AND HEMATOCRIT, BLOOD
HEMATOCRIT: 36.7 % (ref 36.0–46.0)
Hemoglobin: 12 g/dL (ref 12.0–15.0)

## 2016-12-08 NOTE — Progress Notes (Signed)
1 Day Post-Op   Subjective/Chief Complaint:  1 - LEFT Ureteral Stricture - s/p LEFT robotic ureteral reimplant with psoas hitch on 12/07/16 the day of admission.  Today "Lauren [name]" is w/o complaints. Pain controlled. JP output minimal, Hbg and Cr excellent.     Objective: Vital signs in last 24 hours: Temp:  [98 F (36.7 C)-98.7 F (37.1 C)] 98 F (36.7 C) (08/16 0513) Pulse Rate:  [57-75] 67 (08/16 0513) Resp:  [11-18] 15 (08/16 0513) BP: (105-128)/(60-78) 105/74 (08/16 0513) SpO2:  [98 %-100 %] 98 % (08/16 0513) Weight:  [76.7 kg (169 lb)] 76.7 kg (169 lb) (08/15 1107)    Intake/Output from previous day: 08/15 0701 - 08/16 0700 In: 2995 [P.O.:600; I.V.:2395] Out: 5277 [Urine:1130; Drains:65; Blood:50] Intake/Output this shift: No intake/output data recorded.  General appearance: alert, cooperative, appears stated age and family at bedside helping translate.  Eyes: negative Nose: Nares normal. Septum midline. Mucosa normal. No drainage or sinus tenderness. Throat: lips, mucosa, and tongue normal; teeth and gums normal Neck: supple, symmetrical, trachea midline Back: symmetric, no curvature. ROM normal. No CVA tenderness. Resp: non-labored on room air.  Cardio: Nl rate Pelvic: external genitalia normal and foley in place with yellow urine.  Pulses: 2+ and symmetric Skin: Skin color, texture, turgor normal. No rashes or lesions Lymph nodes: Cervical, supraclavicular, and axillary nodes normal. Neurologic: Grossly normal Incision/Wound: Recent port sites c/d/i. JP with scant serosanguinous output that is non-foul.   Lab Results:   Recent Labs  12/07/16 1701 12/08/16 0422  HGB 13.1 12.0  HCT 39.9 36.7   BMET  Recent Labs  12/08/16 0422  NA 137  K 3.8  CL 104  CO2 23  GLUCOSE 173*  BUN 9  CREATININE 0.62  CALCIUM 8.8*   PT/INR No results for input(s): LABPROT, INR in the last 72 hours. ABG No results for input(s): PHART, HCO3 in the last 72  hours.  Invalid input(s): PCO2, PO2  Studies/Results: No results found.  Anti-infectives: Anti-infectives    Start     Dose/Rate Route Frequency Ordered Stop   12/07/16 1015  ceFAZolin (ANCEF) IVPB 2g/100 mL premix     2 g 200 mL/hr over 30 Minutes Intravenous 30 min pre-op 12/07/16 1015 12/07/16 1429      Assessment/Plan:  Doing very well POD 1. Saline lock, reg diet, ambulate. Plan for DC foley in AM tomorrow, and then JP after if output remains low, and possible DC tomorrow PM. Pt updated on plan.   [facility], Nicki Gracy 12/08/2016

## 2016-12-08 NOTE — Progress Notes (Signed)
Pt ambulated in halls 180 feet, very slow tolerated well, currently in chair. SRP RN

## 2016-12-08 NOTE — Op Note (Signed)
NAME:  Lauren [name], Lauren [name] NO.:  MEDICAL RECORD NO.:  [id number]  LOCATION:                                 FACILITY:  PHYSICIAN:  Alexis Frock, MD     DATE OF BIRTH:  Aug 22, 1975  DATE OF PROCEDURE: 12/07/2016                              OPERATIVE REPORT   DIAGNOSES:  High-grade left distal ureteral stricture, history of ureterovaginal fistula.  PROCEDURE:  Robotic-assisted laparoscopic left ureteral reimplant with psoas hitch.  ESTIMATED BLOOD LOSS:  50 mL.  COMPLICATION:  None.  SPECIMEN:  None.  FINDINGS: 1. High-grade distal left ureteral stricture with severe surrounding     retroperitoneal inflammation. 2. Excellent reapproximation of left ureter to bladder dome without     tension following psoas hitch to the left.  DRAINS: 1. Jackson-Pratt drain to bulb suction. 2. Foley catheter to straight drain.  INDICATION:  [name] is a very pleasant 41 year old lady who has a very recent history of left distal ureteral injury following cesarean section, complicated by retroperitoneal urinoma, ureterovaginal fistula and hydronephrosis.  She was temporized with stenting in the acute setting, which fortunately allowed her ureterovaginal fistula to completely resolve clinically.  Given the nature of injury, she underwent diagnostic ureteroscopy before complete stone removal and it was found that she developed a very very high-grade distal ureteral stricture, such that, it would not even accommodate the guidewire and ureteroscope and appeared very devascularized.  It was clearly felt that left ureteral reimplantation would be the most efficacious means going forward to achieve stone-free status, which she wished to proceed. Informed consent was obtained and placed in the medical record.  PROCEDURE IN DETAIL:  The patient being Lauren [name], was verified. Procedure being left robotic reimplantation was confirmed.  Procedure was  carried out.  Time-out was performed.  Intravenous antibiotics were administered.  General endotracheal anesthesia was introduced.  The patient was placed into a low lithotomy position.  Sterile field was created by prepping and draping the patient's vagina, introitus and proximal thighs using iodine and her infra-xiphoid abdomen using chlorhexidine gluconate.  She was further fashioned to the operating table using 3-inch tape over foam padding across her supraxiphoid chest. A test of steep Trendelenburg positioning was performed, she was found to be suitably positioned.  A high-flow, low-pressure pneumoperitoneum was obtained using Veress technique in the infraumbilical midline having passed the aspiration and drop test, and after a 20-French Foley catheter was placed per urethra to straight drain onto the operative field, an 8-mm robotic camera port was then placed into the same location.  Laparoscopic examination of the peritoneal cavity revealed no significant adhesions and no visceral injury.  Additional ports were placed then as follows:  Right paramedian 8-mm robotic port, right far lateral 12-mm AirSeal assistant port, right paramedian 5-mm suction port, left paramedian 8-mm robotic port, left far lateral 8-mm robotic port.  Robot was docked and passed through the electronic checks. Initial attention was directed at identification of the left ureter. Incision was made lateral to the left medial umbilical ligament from the anterior surface towards the area of the iliac vessels and also lateral to the left ovary and tubes toward the area of the iliac vessels, which were visualized.  First, the level of the external incision was made also in the posterior retroperitoneum medial to the ovary and tubes directly onto the area of the common iliac vessels.  Retroperitoneal dissection was also performed from this approach on the medial aspect of the ureter.  These two dissection planes were  carried back and forth until the left ureter was identified at approximately the level of the crossing of the iliacs.  There was a very dense inflammatory rind around the area of the ureter consistent with likely inflammation from prior urinoma.  The ureter was traced distally to the area of the superior vesical artery.  At this point, which was felt to be clearly above the area of a stricture based on prior retrograde ureteroscopy.  The ureter was coldly transected, taking exquisite care to avoid injury to the in- situ stent, which had been recently placed.  The distal stump was oversewn using running 3-0 V-Loc in a running fashion.  The ureter was then circumferentially mobilized for distance approximately 7 cm proximal to the area of purposeful transection, taking exquisite care to avoid devascularization of the ureter, which did not occur.  This was performed well above the area of the iliacs and exquisite care was taken to avoid devascularization of the left ovary or damage to the tube, which also did not occur.  Having achieved what appeared to be suitable on ureteral mobility, the bladder was filled with approximately 200 mL of fluid, and given orientation, it was felt that likely psoas hitch would be required to achieve a tension-free ureteral reimplant.  As such, the bladder was freed from the anterior abdominal wall and the right medial umbilical ligament was mobilized as well to allow the bladder and bladder dome to be shifted to the left side.  Lateral superior perivesical tissue was then brought into the area overlying the psoas musculature for hitching as this provided excellent mobilization of the bladder dome towards the area of the ureter in a tension-free fashion.  Psoas hitch was performed using 2 figure-of-eight PDS sutures to the area of the left medial umbilical ligament into the psoas tendon parallel to this, taking exquisite care to avoid presumed course of nerves  in the psoas musculature.  Bladder dome was easily in direct apposition to the ureter.  The ureter was spatulated for distance approximately 1 cm, it appeared to be excessively vascularized and without obvious stricturing.  The bladder dome was dissected down to the bladder mucosa and a heel stitch of 3-0 Monocryl was applied, and the bladder mucosa then transected for distance approximately 1 cm.  Distal ureteral to bladder anastomosis was then performed using two separate running suture lines of running 3-0 Monocryl in a healed fashion, which resulted in excellent mucosal apposition, taking exquisite care to avoid back-walling or sewing of the stent into the anastomosis, which did not occur.  The anastomosis was tension free.  The bladder was then filled with 300 mL and there was no evidence of leak seen whatsoever. Following these maneuvers, hemostasis appeared excellent.  All sponge and needle counts were correct.  A closed suction drain was brought through the previous left lateral most robotic port site near the peritoneal cavity away from the area of anastomosis.  Robot was then undocked.  The right 12-mm assistant port site was closed at the level of the fascia using Carter-Thomason suture passer and 0 Vicryl under laparoscopic vision.  All incision sites were infiltrated with dilute lyophilized Marcaine and closed at the level of  the skin using subcuticular Monocryl followed by Dermabond.  Procedure was then terminated.  The patient tolerated the procedure well.  There were no immediate periprocedural complications.  The patient was taken to the postanesthesia care unit in stable condition.  Please note, first assistant, Debbrah Alar, was absolutely crucial for all portions of the robotic procedure today.  She provided invaluable intraoperative retraction, sectioning, suture passage, without which, this would not be possible.          ______________________________ Alexis Frock, MD     TM/MEDQ  D:  12/07/2016  T:  12/08/2016  Job:  [id number]

## 2016-12-08 NOTE — Progress Notes (Signed)
RN tried for a third time to encourage the patient to ambulate.  Patient refused again.

## 2016-12-09 MED ORDER — ACETAMINOPHEN 500 MG PO TABS
1000.0000 mg | ORAL_TABLET | Freq: Four times a day (QID) | ORAL | Status: DC
  Administered 2016-12-09 (×2): 1000 mg via ORAL
  Filled 2016-12-09 (×2): qty 2

## 2016-12-09 MED ORDER — KETOROLAC TROMETHAMINE 15 MG/ML IJ SOLN
15.0000 mg | Freq: Once | INTRAMUSCULAR | Status: AC
  Administered 2016-12-09: 15 mg via INTRAVENOUS
  Filled 2016-12-09: qty 1

## 2016-12-09 MED ORDER — OXYCODONE HCL 5 MG PO TABS
5.0000 mg | ORAL_TABLET | ORAL | Status: DC | PRN
  Administered 2016-12-09 (×2): 10 mg via ORAL
  Filled 2016-12-09 (×2): qty 2

## 2016-12-09 NOTE — Progress Notes (Signed)
Patient urinated 450cc and PVR=300  patient denies any feeling of bladder fullness. Dr. Junious Silk notified and stated it is okay as long as the patient is voiding and not complaining of any pressure.

## 2016-12-09 NOTE — Progress Notes (Signed)
Pt complained of L leg numbness, notified Eskridge MD. Told it was normal, to let pt know if not better, come to office on Monday.

## 2016-12-09 NOTE — Progress Notes (Signed)
Report given by previous nurse Sophia Pickett-RN, no new changes from prior assessment, will continue to monitor patient.

## 2016-12-11 NOTE — Discharge Summary (Signed)
[facility] Discharge Summary  Admit date: 12/07/2016  Discharge date and time:12/09/2016  Discharge to: Home  Discharge Service: Urology  Discharge Attending Physician: Dr. Alexis Frock  Discharge  Diagnoses: Ureteral Stritcure  Secondary Diagnosis: Active Problems:   Ureteral stricture   OR Procedures: Procedure(s): XI ROBOTICALLY ASSISTED LAPAROSCOPIC URETERAL RE-IMPLANTATION 12/07/2016   Ancillary Procedures: None   Discharge Day Services: The patient was seen and examined by the Urology team both in the morning and immediately prior to discharge.  Vital signs and laboratory values were stable and within normal limits.  The physical exam was benign and unchanged and all surgical wounds were examined.  Discharge instructions were explained and all questions answered.  Subjective  No acute events overnight. Pain Controlled. No fever or chills.  Objective No data found.  Total I/O In: 720 [P.O.:720] Out: 5361 [Urine:1500; Drains:85]  General Appearance:        No acute distress Lungs:                 Normal work of breathing on room air Heart:                             Regular rate and rhythm Abdomen:                       Soft, non-tender, non-distended. Incisions clean dry and intact. JP drain site dressed appropriately.  Extremities:                  Warm and well perfused    Hospital Course:  The patient underwent left robotic assisted ureterolysis and ureteroneocystotomy on 12/07/2016.  The patient tolerated the procedure well, was extubated in the OR, and afterwards was taken to the PACU for routine post-surgical care. When stable the patient was transferred to the floor.   The patient did well postoperatively.  The patient's diet was slowly advanced and at the time of discharge was tolerating a regular diet. Foley catheter was removed on the morning of POD2, and patient was able to void spontaneously. JP drain output was evaluated following catheter removal,  and after putting out minimal serosanguinous output, JP drain was removed without complication prior to discharge.  The patient was discharged home 2 Days Post-Op, at which point was tolerating a regular solid diet, was able to void spontaneously, have adequate pain control with P.O. pain medication, and could ambulate without difficulty. The patient will follow up with [place] for post op check.   Condition at Discharge: Improved  Discharge Medications:  Allergies as of 12/09/2016   No Known Allergies     Medication List    STOP taking these medications   traMADol 50 MG tablet Commonly known as:  ULTRAM     TAKE these medications   HYDROcodone-acetaminophen 5-325 MG tablet Commonly known as:  NORCO Take 1-2 tablets by mouth every 6 (six) hours as needed for moderate pain or severe pain.   senna-docusate 8.6-50 MG tablet Commonly known as:  Senokot-S Take 1 tablet by mouth 2 (two) times daily. While taking pain meds to prevent constipation. Please label in Spanish.       Pending Test Results: None  Discharge Instructions:  Discharge Instructions    Call MD for:  persistant nausea and vomiting    Complete by:  As directed    Call MD for:  redness, tenderness, or signs of infection (pain, swelling, redness, odor or  green/yellow discharge around incision site)    Complete by:  As directed    Call MD for:  severe uncontrolled pain    Complete by:  As directed    Call MD for:  temperature >100.4    Complete by:  As directed    Diet general    Complete by:  As directed    Discharge instructions    Complete by:  As directed    Activity:  You are encouraged to ambulate frequently (about every hour during waking hours) to help prevent blood clots from forming in your legs or lungs.  However, you should not engage in any heavy lifting (> 10-15 lbs), strenuous activity, or straining. Diet: You should advance your diet as instructed by your physician.  It will be normal to have some  bloating, nausea, and abdominal discomfort intermittently. Prescriptions:  You will be provided a prescription for pain medication to take as needed.  If your pain is not severe enough to require the prescription pain medication, you may take extra strength Tylenol instead which will have less side effects.  You should also take a prescribed stool softener to avoid straining with bowel movements as the prescription pain medication may constipate you. Incisions: You may remove your dressing bandages 48 hours after surgery if not removed in the hospital.  You will either have some small staples or special tissue glue at each of the incision sites. Once the bandages are removed (if present), the incisions may stay open to air.  You may start showering (but not soaking or bathing in water) the 2nd day after surgery and the incisions simply need to be patted dry after the shower.  No additional care is needed. What to call us about: You should call the office [phone]) if you develop fever > 101 or develop persistent vomiting.   Increase activity slowly    Complete by:  As directed    Lifting restrictions    Complete by:  As directed    No lifting greater than 10 pounds for 4-6 weeks   May shower / Bathe    Complete by:  As directed       I have seen and examined the patient and agree with Dr. Samuel Jester plan for DC. Pt has met all goals.

## 2018-07-17 IMAGING — CR DG CHEST 2V
2 series · 2 of 2 positions shown · non-contrast
Comparison: None.

CLINICAL DATA: Preop ureteral re- implantation surgery scheduled
for 12/07/2016. Productive cough x2 days.

EXAM:
CHEST  2 VIEW

[w chest pa]
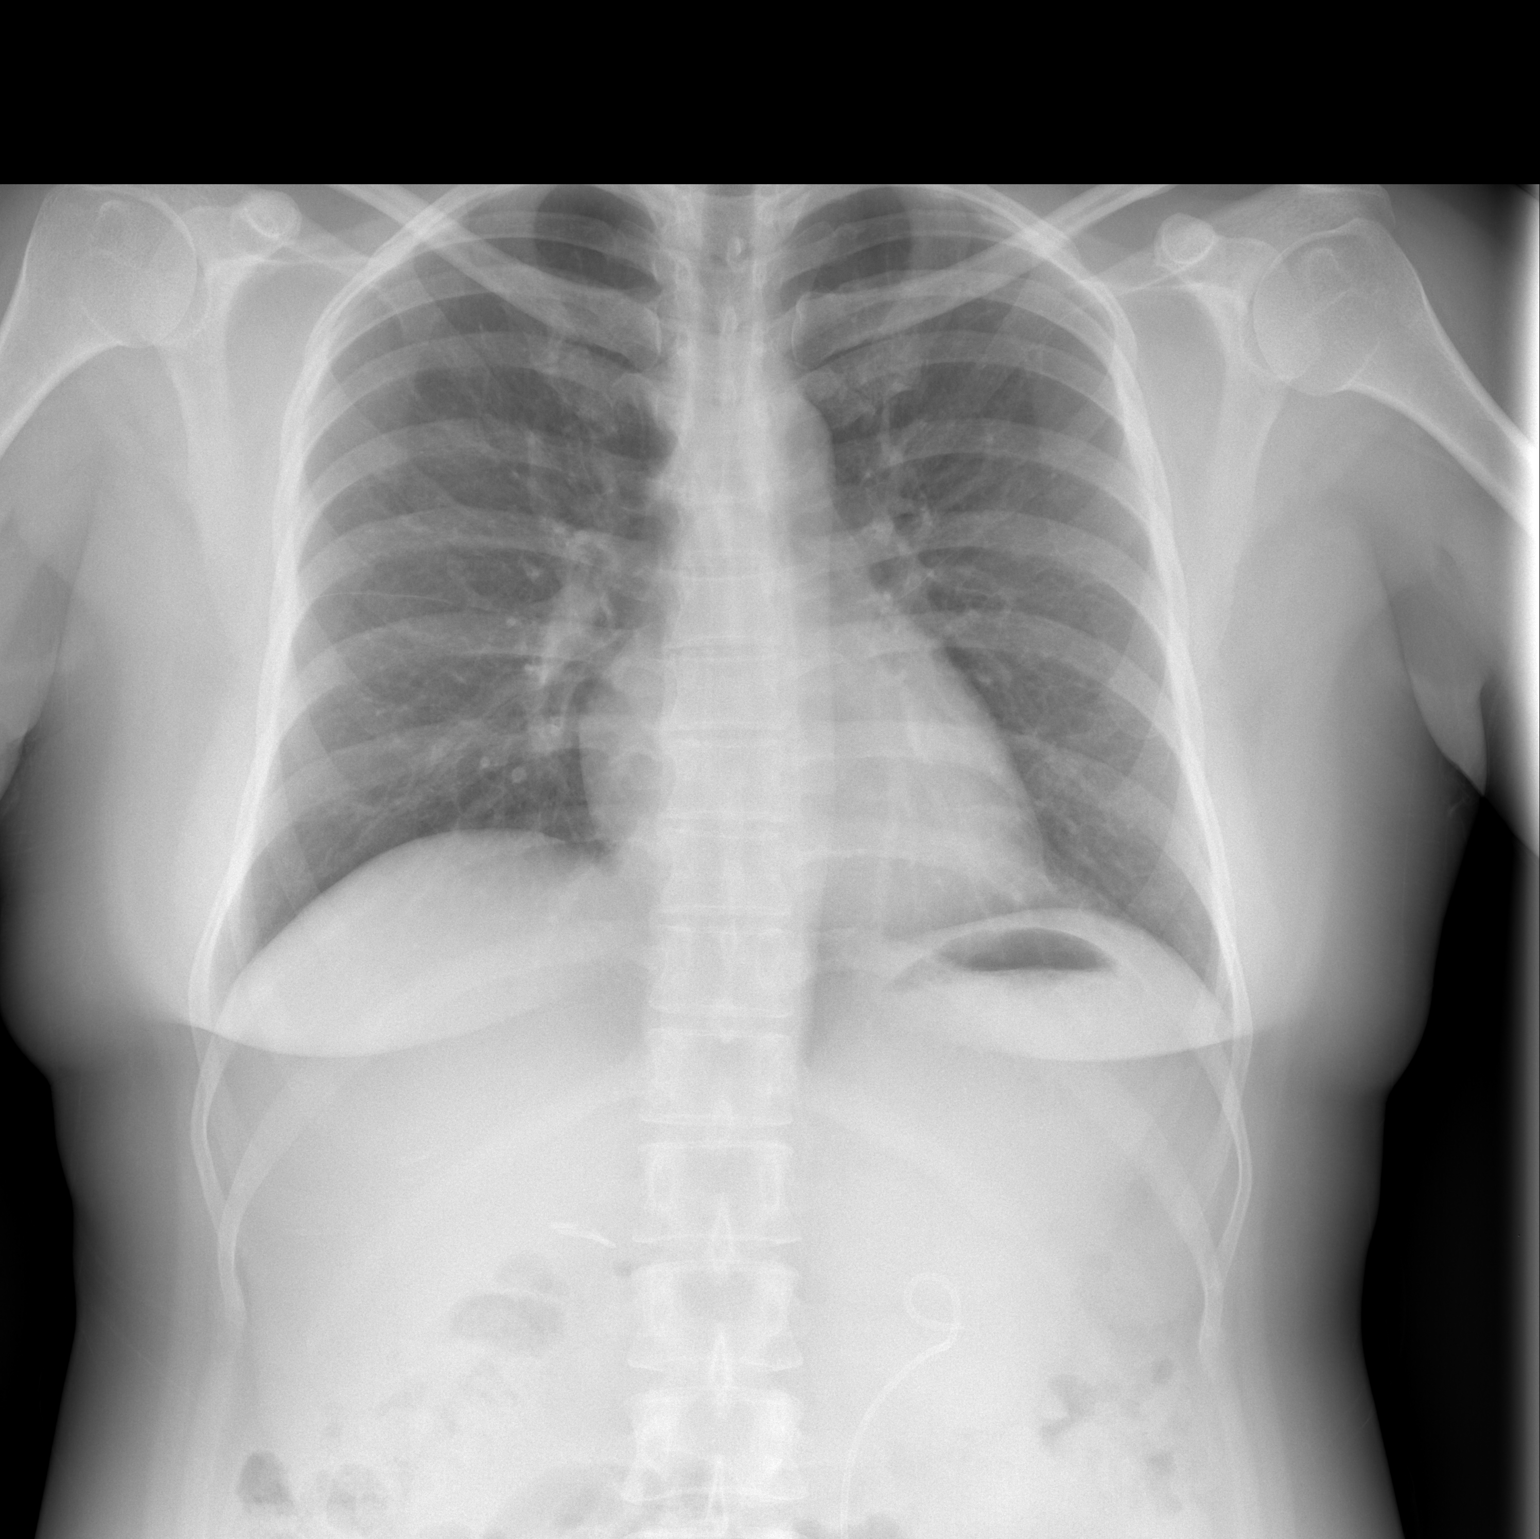

[w chest lat]
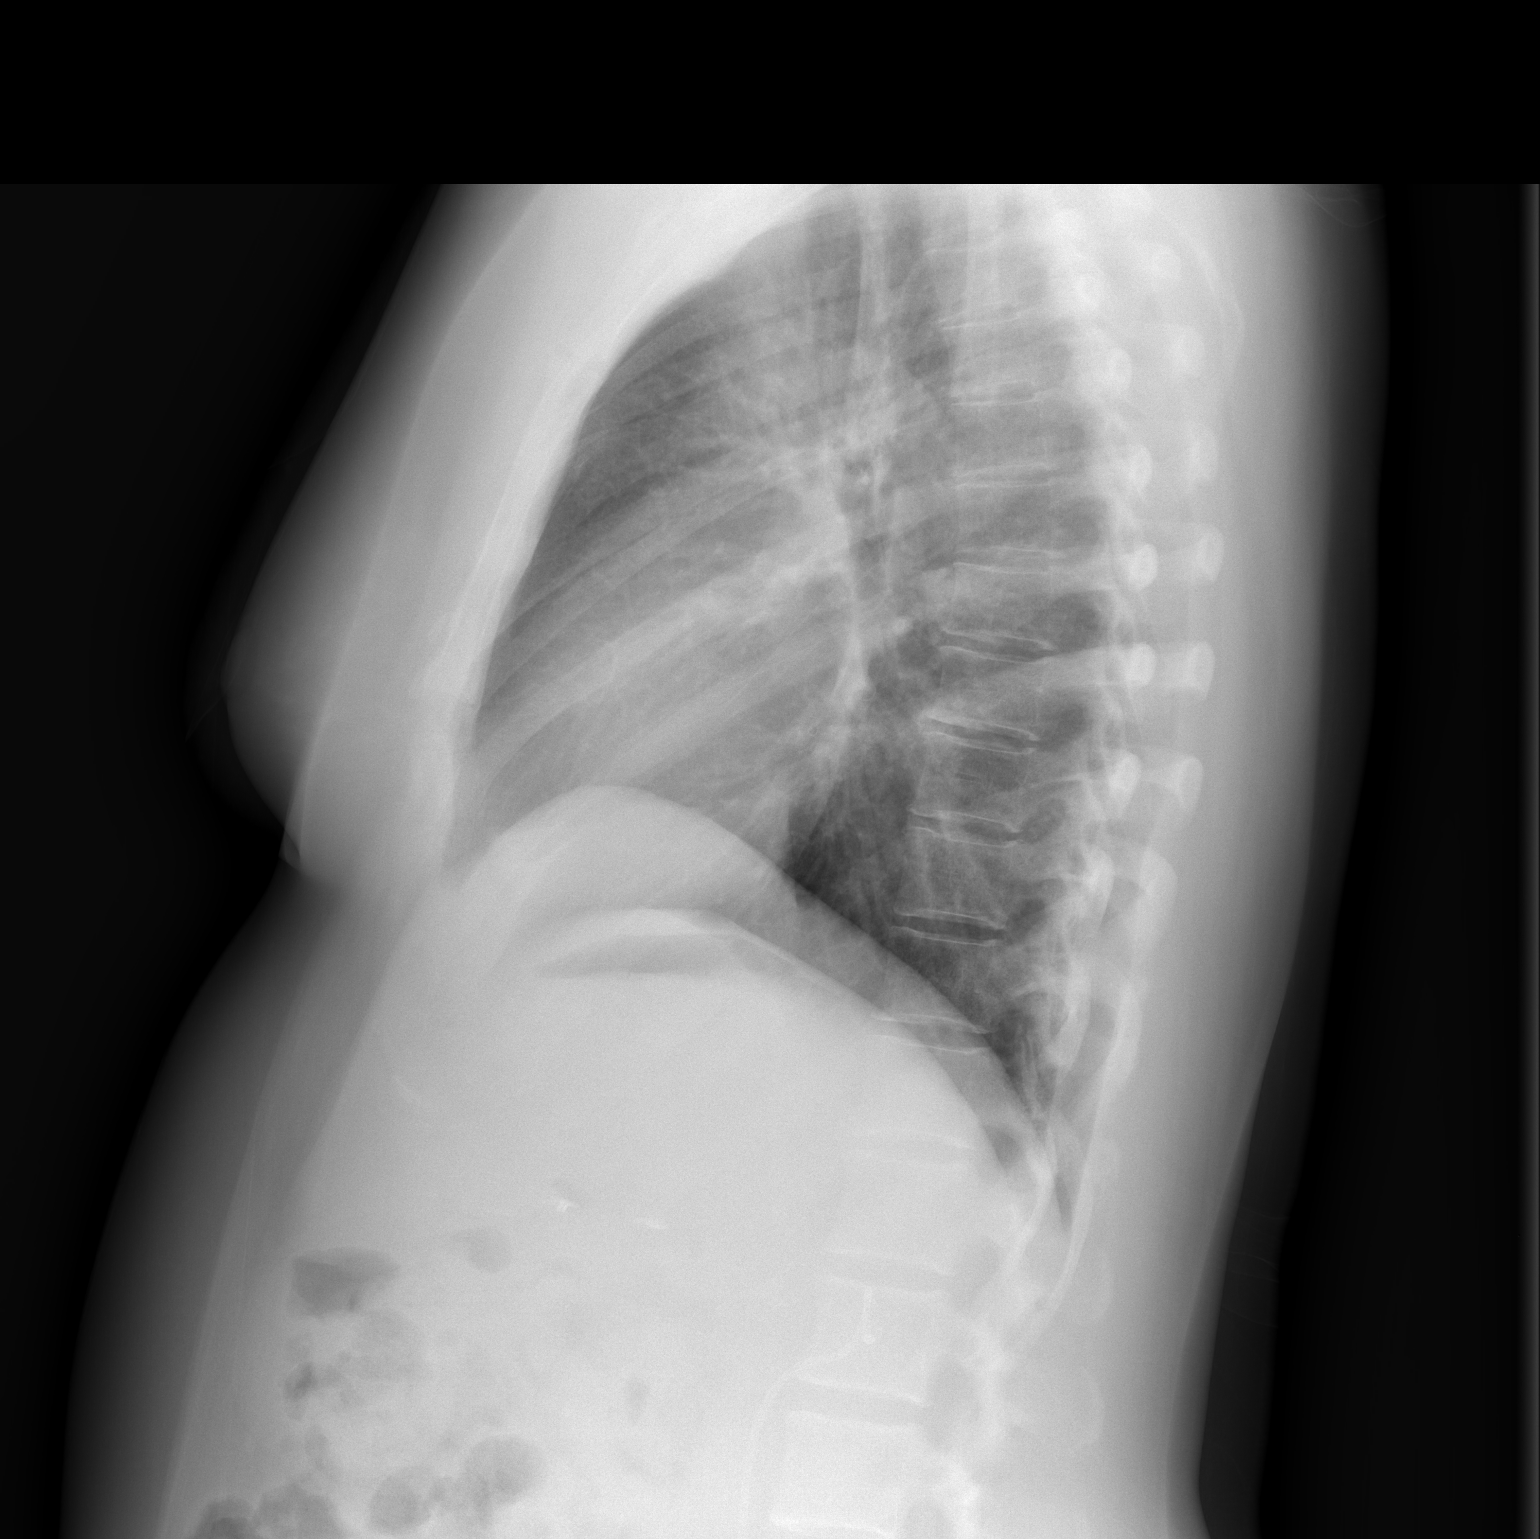

[2 of 2 positions shown; findings below may reference images not displayed]

FINDINGS: The heart size and mediastinal contours are within normal limits.
Mild interstitial prominence consistent with acute bronchitic
change. No pneumonia. The visualized skeletal structures are
unremarkable. Cholecystectomy clips in the right upper quadrant.
Partially imaged left ureteral stent.
IMPRESSION: Increased interstitial prominence consistent with acute bronchitic
change. No pneumonic consolidation.

## 2019-06-25 ENCOUNTER — Telehealth: Payer: Self-pay | Admitting: Pediatric Intensive Care

## 2019-06-25 NOTE — Telephone Encounter (Signed)
Call from client with Lauren [name], case worker at [employer] for interpretation. CN advised client of 3/23 mobile clinics at [facility] and client will come in afternoon to establish primary care and for evaluation of lower extremity numbness. CN advised clientt that she spoke with urology clinic and that all bills associated with 12/07/2016 had been paid. Lisette Abu RN BSN CNP [phone]

## 2019-07-16 ENCOUNTER — Ambulatory Visit: Payer: Self-pay | Admitting: Family Medicine

## 2019-07-16 ENCOUNTER — Other Ambulatory Visit: Payer: Self-pay

## 2019-07-16 VITALS — BP 129/82 | HR 88 | Temp 98.2°F | Resp 18 | Ht 63.0 in | Wt 163.0 lb

## 2019-07-16 DIAGNOSIS — Z13228 Encounter for screening for other metabolic disorders: Secondary | ICD-10-CM

## 2019-07-16 DIAGNOSIS — Z789 Other specified health status: Secondary | ICD-10-CM

## 2019-07-16 DIAGNOSIS — R7303 Prediabetes: Secondary | ICD-10-CM

## 2019-07-16 DIAGNOSIS — M792 Neuralgia and neuritis, unspecified: Secondary | ICD-10-CM

## 2019-07-16 DIAGNOSIS — Z13220 Encounter for screening for lipoid disorders: Secondary | ICD-10-CM

## 2019-07-16 DIAGNOSIS — Z1389 Encounter for screening for other disorder: Secondary | ICD-10-CM

## 2019-07-16 LAB — POCT URINALYSIS DIP (CLINITEK)
Bilirubin, UA: NEGATIVE
Blood, UA: NEGATIVE
Glucose, UA: NEGATIVE mg/dL
Ketones, POC UA: NEGATIVE mg/dL
Nitrite, UA: NEGATIVE
POC PROTEIN,UA: NEGATIVE
Spec Grav, UA: 1.025 (ref 1.010–1.025)
Urobilinogen, UA: 0.2 E.U./dL
pH, UA: 7 (ref 5.0–8.0)

## 2019-07-16 LAB — POCT GLYCOSYLATED HEMOGLOBIN (HGB A1C): Hemoglobin A1C: 6 % — AB (ref 4.0–5.6)

## 2019-07-16 LAB — GLUCOSE, POCT (MANUAL RESULT ENTRY): POC Glucose: 103 mg/dl — AB (ref 70–99)

## 2019-07-16 MED ORDER — GABAPENTIN 100 MG PO CAPS: ORAL_CAPSULE | ORAL | 3 refills | Status: DC

## 2019-07-16 MED ORDER — GABAPENTIN 100 MG PO CAPS: ORAL_CAPSULE | ORAL | 3 refills | Status: AC

## 2019-07-16 MED FILL — GABAPENTIN 100 MG CAPSULE: 100 | 30 days supply | Qty: 90 | Fill #0

## 2019-07-16 NOTE — Patient Instructions (Signed)
Pick-up medications from [facility] [address] [place], [place] [address] Take medication as prescribed.   Medication may take up to 3 to 4 weeks for to meet its maximum effectiveness in resolving your pain. I have placed a referral for you to be evaluated by neurologist to determine the cause of your left back pain.  They will contact you directly to schedule an appointment.  You have also been referred to establish care with a primary care provider.  They can assist you with setting up an appointment with financial counselor to obtain financial assistance.  [facility] [address], [place], [place] [address] Tome los medicamentos segn lo recetado.   Los medicamentos pueden tardar de [employer] 3 a 4 semanas en alcanzar su mxima eficacia en la resolucin del dolor. He colocado una referencia para que usted sea evaluado por el neurlogo para determinar la causa de su dolor en la espalda izquierda.  Se pondr en contacto directamente con usted para programar una cita.  Tambin se le ha referido para Actor con un proveedor de Midwife.  Pueden ayudarle a establecer una cita con un consejero financiero para obtener asistencia financiera.   Plan de alimentacin para personas con prediabetes Prediabetes Eating Plan La prediabetes es una afeccin que hace que los niveles de azcar en la sangre (glucosa) sean ms altos de lo normal. Esto aumenta el riesgo de tener diabetes. Para prevenir la diabetes, es posible que su mdico le recomiende cambios en la dieta y otros cambios en su estilo de vida que lo ayuden a Scientist, forensic lo siguiente:  Chief Technology Officer los niveles de glucemia.  Mejorar los niveles de [place].  Controlar la presin arterial. El mdico puede recomendarle que trabaje con un especialista en alimentacin y nutricin (nutricionista) para Higher education careers adviser de comidas ms conveniente para  usted. Consejos para seguir este plan: Estilo de vida  Establezca metas para bajar de peso con la ayuda de su equipo de atencin mdica. A la [employer] con prediabetes se les recomienda bajar un 7% de su peso corporal.  Haga ejercicio al menos 68minutos 5das por semana, como mnimo.  Asista a un grupo de apoyo o solicite el apoyo continuo de un consejero de salud mental.  [employer] medicamentos de venta libre y los recetados solamente como se lo haya indicado el mdico. Leer las etiquetas de los alimentos  Lea las etiquetas de los alimentos envasados para controlar la cantidad de grasa, sal (sodio) y azcar que contienen. Evite los alimentos que contengan lo siguiente: ? Grasas saturadas. ? Grasas trans. ? Azcares agregados.  Evite los alimentos que contengan ms de 358miligramos(mg) de sodio por porcin. Limite el consumo diario de sodio a menos de 2300mg  por Training and development officer. De compras  Evite comprar alimentos procesados y preelaborados. Coccin  Cocine con aceite de oliva. No use mantequilla, manteca de cerdo o [employer].  Cocine los alimentos al horno, a la parrilla, asados o hervidos. Evite frerlos. Planificacin de las comidas   Trabaje con el nutricionista para crear un plan de alimentacin que sea adecuado para usted. Esto puede incluir lo siguiente: ? Registro de la cantidad de caloras que ingiere. Use un registro de alimentos, un cuaderno o una aplicacin mvil para anotar lo que comi en cada comida. ? Uso del ndice glucmico (IG) para planificar las comidas. El ndice [place] con qu rapidez elevar la glucemia un alimento. Elija alimentos con bajo IG. Estos  demoran ms en elevar la glucemia.  Considere la posibilidad de seguir Web designer. Esta dieta incluye lo siguiente: ? Varias porciones de frutas y verduras frescas por da. ? Pescado al [employer] veces por semana. ? Varias porciones de cereales integrales, frijoles, frutos  secos y semillas por da. ? Aceite de Tour manager de otras grasas. ? Consumo moderado de alcohol. ? Pequeas cantidades de carnes rojas y lcteos enteros.  Si tiene hipertensin arterial, quizs Development worker, international aid consumo de sodio o seguir una dieta como el plan de alimentacin basado en Enfoques Alimentarios para Detener la Hipertensin (Dietary Approaches to Stop Hypertension, DASH). Este es un plan de alimentacin cuyo objetivo es bajar la hipertensin arterial. Qu alimentos se recomiendan? Es posible que los alimentos incluidos a continuacin no Systems developer. Hable con el nutricionista sobre las mejores opciones alimenticias para usted. Cereales Productos integrales, como panes, galletas, cereales y pastas de salvado o integrales. Avena sin azcar. Trigo burgol. Cebada. Quinua. Arroz integral. Tacos o tortillas de harina de maz o de salvado. [place] Valeda Malm. Espinaca. Guisantes. Remolachas. Coliflor. Repollo. Brcoli. Zanahorias. Tomates. Calabaza. Augustin Coupe. Hierbas. Pimienta. Cebollas. Pepinos. Repollitos de Bruselas. Frutas Frutos rojos. Bananas. Manzanas. Naranjas. Uvas. Papaya. Mango. [place]. Kiwi. Pomelo. Cerezas. Carnes y otros alimentos ricos en protenas Mariscos. Carne de ave sin piel. Cortes magros de cerdo y carne de res. Tofu. Huevos. Frutos secos. Frijoles. Lcteos Productos lcteos descremados o semidescremados, como yogur, queso cottage y [place]. [employer]. T. Caf. Gaseosas sin azcar o dietticas. Soda. Leche descremada o semidescremada. Productos alternativos a la [place], como [place] de soja o de [place]. Grasas y aceites Aceite de [place]. Aceite de canola. Aceite de girasol. Aceite de semillas de uva. [place]. Nueces. Dulces y postres Pudin sin azcar o con bajo contenido de [place]. Helado y otros postres congelados sin azcar o con bajo contenido de [place]. Condimentos y otros alimentos Hierbas. Especias sin sodio. Mostaza. Salsa de pepinillos.  [place] con bajo contenido de [place] y de Location manager. Salsa barbacoa con bajo contenido de grasa y de azcar. Mayonesa con bajo contenido de grasa o sin grasa. Qu alimentos no se recomiendan? Es posible que los alimentos incluidos a continuacin no Systems developer. Hable con el nutricionista sobre las mejores opciones alimenticias para usted. Cereales Productos elaborados con [place] y [place], como panes, pastas, bocadillos y cereales. Verduras Verduras enlatadas. Verduras congeladas con mantequilla o salsa de crema. Lambert Mody Frutas enlatadas al almbar. Carnes y otros alimentos ricos en protenas Cortes de carne con grasa. Carne de ave con piel. Carne empanizada o frita. Carnes procesadas. Lcteos Yogur, [place] enteros. Bebidas Bebidas azucaradas, como t helado dulce y [place]. Grasas y [employer]. Loup. Mantequilla clarificada. Dulces y [employer], como pasteles, pastelitos, galletas dulces y tarta de [place]. Condimentos y otros alimentos Mezclas de especias con sal agregada. [place]. Salsa barbacoa. Mayonesa. Resumen  Para prevenir la diabetes, es posible que Astronomer en la dieta y otros cambios en su estilo de vida para ayudar a Diplomatic Services operational officer en la sangre, mejorar los niveles de colesterol y Aeronautical engineer presin arterial.  Establezca metas para bajar de peso con la ayuda de su equipo de atencin mdica. A la [employer] con prediabetes se les recomienda bajar un 7por ciento de su peso corporal.  Considere la posibilidad de seguir una dieta mediterrnea que incluya muchas frutas y verduras frescas, cereales integrales, frijoles, frutos secos, semillas, pescado, carnes  magras, lcteos descremados y aceites saludables. Esta informacin no tiene Marine scientist el consejo del mdico. Asegrese de hacerle al mdico cualquier pregunta que tenga. Document Revised: 10/24/2016 Document  Reviewed: 10/24/2016 Elsevier Patient Education  [place].

## 2019-07-16 NOTE — Progress Notes (Signed)
Patient ID: Lauren [name], female    DOB: 09-13-1975, 44 y.o.   MRN: [id number]  PCP: Patient, No Pcp Per  Chief Complaint  Patient presents with  . Leg Thigh Pain    Left   . Prediabetes    Subjective:  HPI Lauren [name] is a 45 y.o. female presents as a new patient to the mobile clinic for evaluation of 3 years of left leg numbness, burning, and weakness.   Lauren [name] has Gall bladder disease; Pregnancy; Ureteral fistula; and Ureteral stricture on their problem list.  Lauren [name] reports following a surgery for ureteral stricture she complained of left leg weakness and numbness immediately occurring following surgery 11/2016. She reports that her urologist at the time advised her that the symptoms of leg weakness and numbness were a normal finding following the type of surgery she had and symptoms would resolve without intervention. She reports never regaining completes sensation of left thigh and of recent she experiences significant burning pain with light touch of her tight or with any tactile stimulation of her thigh. She denies this pain occurs anywhere else on her body. She has not received any follow-up primary care evaluation since her surgery. She feels pain is worsening.  Patient denies injury. No recent routine labs. Patient's last menstrual period was 06/24/2019.  Denies pain with urination or back pain.   Routine health maintenance: PAP SMEAR overdue TDAP overdue- will hold administration as patient is scheduled to receive COVID-19 vaccine.   Social History   Socioeconomic History  . Marital status: Significant Other    Spouse name: Not on file  . Number of children: Not on file  . Years of education: Not on file  . Highest education level: Not on file  Occupational History  . Not on file  Tobacco Use  . Smoking status: Never Smoker  . Smokeless tobacco: Never Used  Substance and Sexual Activity  . Alcohol  use: No  . Drug use: No  . Sexual activity: Not Currently    Birth control/protection: None  Other Topics Concern  . Not on file  Social History Narrative  . Not on file   Social Determinants of Health   Financial Resource Strain:   . Difficulty of Paying Living Expenses:   Food Insecurity:   . Worried About Charity fundraiser in the Last Year:   . Arboriculturist in the Last Year:   Transportation Needs:   . Film/video editor (Medical):   Marland Kitchen Lack of Transportation (Non-Medical):   Physical Activity:   . Days of Exercise per Week:   . Minutes of Exercise per Session:   Stress:   . Feeling of Stress :   Social Connections:   . Frequency of Communication with Friends and Family:   . Frequency of Social Gatherings with Friends and Family:   . Attends Religious Services:   . Active Member of Clubs or Organizations:   . Attends Archivist Meetings:   Marland Kitchen Marital Status:   Intimate Partner Violence:   . Fear of Current or Ex-Partner:   . Emotionally Abused:   Marland Kitchen Physically Abused:   . Sexually Abused:     Family History  Problem Relation Age of Onset  . Seizures Son   . Hypertension Son   . Diabetes Mother     Review of Systems Pertinent negatives listed in HPI Patient Active Problem List   Diagnosis Date Noted  . Ureteral stricture 12/07/2016  . Ureteral fistula  06/25/2016  . Pregnancy 06/07/2016  . Gall bladder disease 10/17/2012    No Known Allergies  Prior to Admission medications   Not on File    Past Medical, Surgical Family and Social History reviewed and updated.    Objective:   Today's Vitals   07/16/19 1427 07/16/19 1434  BP: 129/82   Pulse: 88   Resp: 18   Temp: 98.2 F (36.8 C)   TempSrc: Oral   SpO2: 98%   Weight: 163 lb (73.9 kg)   Height: 5\' 3"  (1.6 m)   PainSc: 10-Worst pain ever 8   PainLoc: Leg     Wt Readings from Last 3 Encounters:  07/16/19 163 lb (73.9 kg)  12/07/16 169 lb (76.7 kg)  11/29/16 169 lb 12.8  oz (77 kg)   Physical Exam Vitals reviewed.  Constitutional:      Appearance: Normal appearance.  HENT:     Head: Normocephalic.  Cardiovascular:     Rate and Rhythm: Normal rate and regular rhythm.  Pulmonary:     Effort: Pulmonary effort is normal.     Breath sounds: Normal breath sounds.  Abdominal:     General: There is no distension.     Tenderness: There is no abdominal tenderness.  Musculoskeletal:        General: Tenderness present.     Cervical back: Normal range of motion and neck supple.     Left upper leg: Tenderness present. No edema or bony tenderness.  Lymphadenopathy:     Cervical: No cervical adenopathy.  Skin:    General: Skin is warm and dry.  Neurological:     Mental Status: She is oriented to person, place, and time.     Motor: No weakness.     Gait: Gait normal.  Psychiatric:        Behavior: Behavior normal. Behavior is cooperative.     Comments: Tearful       Assessment & Plan:  1. Prediabetes - HgB A1C- 6.0 - Glucose (CBG), 103  2. Neuropathic pain of left thigh - CBC with Differential - Vitamin B12 - Ambulatory referral to Neurology  3. Screening for blood or protein in urine - POCT URINALYSIS DIP (CLINITEK) - Urine Culture  4. Encounter for screening for metabolic disorder - Comprehensive metabolic panel - Thyroid Panel With TSH  5. Lipid screening - Lipid panel  6. Language barrier -Live spanish interpreter used to facilitate continuum of care and promote health.  Meds ordered this encounter  Medications  . gabapentin (NEURONTIN) 100 MG capsule    Sig: Take (1 tablet) 100 mg daily in morning and (2 tablets) 200 mg at bedtime.    Dispense:  90 capsule    Refill:  3    Congregational Nursing      -The patient was given clear instructions to go to ER or return to medical center if symptoms do not improve, worsen or new problems develop. The patient verbalized understanding.   Molli Barrows, FNP (Covering  Provider) [place].  [place], [place] [phone]

## 2019-07-17 ENCOUNTER — Telehealth: Payer: Self-pay | Admitting: *Deleted

## 2019-07-17 LAB — COMPREHENSIVE METABOLIC PANEL
ALT: 46 IU/L — ABNORMAL HIGH (ref 0–32)
AST: 36 IU/L (ref 0–40)
Albumin/Globulin Ratio: 1.5 (ref 1.2–2.2)
Albumin: 4.6 g/dL (ref 3.8–4.8)
Alkaline Phosphatase: 155 IU/L — ABNORMAL HIGH (ref 39–117)
BUN/Creatinine Ratio: 29 — ABNORMAL HIGH (ref 9–23)
BUN: 18 mg/dL (ref 6–24)
Bilirubin Total: <0.2 mg/dL (ref 0.0–1.2)
CO2: 20 mmol/L (ref 20–29)
Calcium: 9.5 mg/dL (ref 8.7–10.2)
Chloride: 103 mmol/L (ref 96–106)
Creatinine, Ser: 0.62 mg/dL (ref 0.57–1.00)
GFR calc Af Amer: 130 mL/min/{1.73_m2} (ref >59)
GFR calc non Af Amer: 112 mL/min/{1.73_m2} (ref >59)
Globulin, Total: 3 g/dL (ref 1.5–4.5)
Glucose: 93 mg/dL (ref 65–99)
Potassium: 3.9 mmol/L (ref 3.5–5.2)
Sodium: 139 mmol/L (ref 134–144)
Total Protein: 7.6 g/dL (ref 6.0–8.5)

## 2019-07-17 LAB — LIPID PANEL
Chol/HDL Ratio: 3.1 ratio (ref 0.0–4.4)
Cholesterol, Total: 197 mg/dL (ref 100–199)
HDL: 63 mg/dL (ref >39)
LDL Chol Calc (NIH): 110 mg/dL — ABNORMAL HIGH (ref 0–99)
Triglycerides: 135 mg/dL (ref 0–149)
VLDL Cholesterol Cal: 24 mg/dL (ref 5–40)

## 2019-07-17 LAB — CBC WITH DIFFERENTIAL/PLATELET
Basophils Absolute: 0.1 10*3/uL (ref 0.0–0.2)
Basos: 1 %
EOS (ABSOLUTE): 1.1 10*3/uL — ABNORMAL HIGH (ref 0.0–0.4)
Eos: 11 %
Hematocrit: 39.6 % (ref 34.0–46.6)
Hemoglobin: 13.1 g/dL (ref 11.1–15.9)
Immature Grans (Abs): 0 10*3/uL (ref 0.0–0.1)
Immature Granulocytes: 0 %
Lymphocytes Absolute: 2.9 10*3/uL (ref 0.7–3.1)
Lymphs: 28 %
MCH: 29.1 pg (ref 26.6–33.0)
MCHC: 33.1 g/dL (ref 31.5–35.7)
MCV: 88 fL (ref 79–97)
Monocytes Absolute: 0.6 10*3/uL (ref 0.1–0.9)
Monocytes: 6 %
Neutrophils Absolute: 5.4 10*3/uL (ref 1.4–7.0)
Neutrophils: 54 %
Platelets: 517 10*3/uL — ABNORMAL HIGH (ref 150–450)
RBC: 4.5 x10E6/uL (ref 3.77–5.28)
RDW: 13.4 % (ref 11.7–15.4)
WBC: 10.1 10*3/uL (ref 3.4–10.8)

## 2019-07-17 LAB — THYROID PANEL WITH TSH
Free Thyroxine Index: 1.9 (ref 1.2–4.9)
T3 Uptake Ratio: 21 % — ABNORMAL LOW (ref 24–39)
T4, Total: 8.9 ug/dL (ref 4.5–12.0)
TSH: 1.23 u[IU]/mL (ref 0.450–4.500)

## 2019-07-17 LAB — VITAMIN B12: Vitamin B-12: >2000 pg/mL — ABNORMAL HIGH (ref 232–1245)

## 2019-07-17 NOTE — Telephone Encounter (Signed)
Medical Assistant used [employer] to contact patient.  Interpreter Name: Durene Cal #: [id number] Patient was not available, [employer] left patient a voicemail. Voicemail states to give a call back to [place] with [facility] at [phone].

## 2019-07-17 NOTE — Telephone Encounter (Signed)
-----   Message from Scot Jun, [place] sent at 07/17/2019  2:12 PM EDT ----- All labs are stable require no intervention at this time. Cholesterol is mildly elevated however, increase physical activity and weight loss will improve this value. B12 and platelet count are both high. If she is taking any supplements with B12 she needs to discontinue. At next follow-up platelet and B12 need to be repeated.

## 2019-07-18 LAB — URINE CULTURE: Organism ID, Bacteria: NO GROWTH

## 2019-08-12 ENCOUNTER — Telehealth: Payer: Self-pay | Admitting: Internal Medicine

## 2022-07-25 ENCOUNTER — Encounter (HOSPITAL_BASED_OUTPATIENT_CLINIC_OR_DEPARTMENT_OTHER): Payer: Self-pay

## 2022-07-25 ENCOUNTER — Other Ambulatory Visit: Payer: Self-pay

## 2022-07-25 ENCOUNTER — Emergency Department (HOSPITAL_BASED_OUTPATIENT_CLINIC_OR_DEPARTMENT_OTHER): Payer: Self-pay | Admitting: Radiology

## 2022-07-25 ENCOUNTER — Emergency Department (HOSPITAL_BASED_OUTPATIENT_CLINIC_OR_DEPARTMENT_OTHER)
Admission: EM | Admit: 2022-07-25 | Discharge: 2022-07-25 | Disposition: A | Discharge status: Home / Self Care | Payer: Self-pay | Attending: Emergency Medicine | Admitting: Emergency Medicine

## 2022-07-25 ENCOUNTER — Emergency Department (HOSPITAL_BASED_OUTPATIENT_CLINIC_OR_DEPARTMENT_OTHER): Payer: Self-pay

## 2022-07-25 DIAGNOSIS — N3000 Acute cystitis without hematuria: Secondary | ICD-10-CM | POA: Insufficient documentation

## 2022-07-25 DIAGNOSIS — R0602 Shortness of breath: Secondary | ICD-10-CM | POA: Insufficient documentation

## 2022-07-25 DIAGNOSIS — E876 Hypokalemia: Secondary | ICD-10-CM | POA: Insufficient documentation

## 2022-07-25 DIAGNOSIS — R202 Paresthesia of skin: Secondary | ICD-10-CM | POA: Insufficient documentation

## 2022-07-25 DIAGNOSIS — R002 Palpitations: Secondary | ICD-10-CM | POA: Insufficient documentation

## 2022-07-25 LAB — URINALYSIS, ROUTINE W REFLEX MICROSCOPIC
Bilirubin Urine: NEGATIVE
Glucose, UA: NEGATIVE mg/dL
Hgb urine dipstick: NEGATIVE
Nitrite: NEGATIVE
Protein, ur: NEGATIVE mg/dL
Specific Gravity, Urine: 1.013 (ref 1.005–1.030)
pH: 6 (ref 5.0–8.0)

## 2022-07-25 LAB — BASIC METABOLIC PANEL
Anion gap: 13 (ref 5–15)
BUN: 13 mg/dL (ref 6–20)
CO2: 21 mmol/L — ABNORMAL LOW (ref 22–32)
Calcium: 9.9 mg/dL (ref 8.9–10.3)
Chloride: 105 mmol/L (ref 98–111)
Creatinine, Ser: 0.46 mg/dL (ref 0.44–1.00)
GFR, Estimated: >60 mL/min (ref >60)
Glucose, Bld: 112 mg/dL — ABNORMAL HIGH (ref 70–99)
Potassium: 2.9 mmol/L — ABNORMAL LOW (ref 3.5–5.1)
Sodium: 139 mmol/L (ref 135–145)

## 2022-07-25 LAB — CBC
HCT: 36.7 % (ref 36.0–46.0)
Hemoglobin: 12 g/dL (ref 12.0–15.0)
MCH: 26.8 pg (ref 26.0–34.0)
MCHC: 32.7 g/dL (ref 30.0–36.0)
MCV: 82.1 fL (ref 80.0–100.0)
Platelets: 491 10*3/uL — ABNORMAL HIGH (ref 150–400)
RBC: 4.47 MIL/uL (ref 3.87–5.11)
RDW: 14 % (ref 11.5–15.5)
WBC: 7.4 10*3/uL (ref 4.0–10.5)
nRBC: 0 % (ref 0.0–0.2)

## 2022-07-25 LAB — D-DIMER, QUANTITATIVE: D-Dimer, Quant: 0.35 ug/mL-FEU (ref 0.00–0.50)

## 2022-07-25 LAB — TROPONIN I (HIGH SENSITIVITY)
Troponin I (High Sensitivity): 4 ng/L (ref <18)
Troponin I (High Sensitivity): 5 ng/L (ref <18)

## 2022-07-25 LAB — HCG, SERUM, QUALITATIVE: Preg, Serum: NEGATIVE

## 2022-07-25 MED ORDER — SODIUM CHLORIDE 0.9 % IV BOLUS
1000.0000 mL | Freq: Once | INTRAVENOUS | Status: AC
  Administered 2022-07-25: 1000 mL via INTRAVENOUS

## 2022-07-25 MED ORDER — CEPHALEXIN 250 MG PO CAPS
500.0000 mg | ORAL_CAPSULE | Freq: Once | ORAL | Status: AC
  Administered 2022-07-25: 500 mg via ORAL
  Filled 2022-07-25: qty 2

## 2022-07-25 MED ORDER — HYDROCODONE-ACETAMINOPHEN 5-325 MG PO TABS
1.0000 | ORAL_TABLET | Freq: Once | ORAL | Status: AC
  Administered 2022-07-25: 1 via ORAL
  Filled 2022-07-25: qty 1

## 2022-07-25 MED ORDER — POTASSIUM CHLORIDE CRYS ER 20 MEQ PO TBCR: 20.0000 meq | EXTENDED_RELEASE_TABLET | Freq: Two times a day (BID) | ORAL | 0 refills | Status: AC

## 2022-07-25 MED ORDER — PHENAZOPYRIDINE HCL 200 MG PO TABS: 200.0000 mg | ORAL_TABLET | Freq: Three times a day (TID) | ORAL | 0 refills | Status: AC

## 2022-07-25 MED ORDER — IOHEXOL 350 MG/ML SOLN
100.0000 mL | Freq: Once | INTRAVENOUS | Status: AC | PRN
  Administered 2022-07-25: 70 mL via INTRAVENOUS

## 2022-07-25 MED ORDER — IOHEXOL 300 MG/ML  SOLN: 100.0000 mL | Freq: Once | INTRAMUSCULAR | Status: DC | PRN

## 2022-07-25 MED ORDER — CEPHALEXIN 500 MG PO CAPS: 500.0000 mg | ORAL_CAPSULE | Freq: Four times a day (QID) | ORAL | 0 refills | Status: AC

## 2022-07-25 MED ORDER — POTASSIUM CHLORIDE CRYS ER 20 MEQ PO TBCR
40.0000 meq | EXTENDED_RELEASE_TABLET | Freq: Once | ORAL | Status: AC
  Administered 2022-07-25: 40 meq via ORAL
  Filled 2022-07-25: qty 2

## 2022-07-25 NOTE — ED Provider Notes (Signed)
[facility] Provider Note   CSN: [id number] Arrival date & time: 07/25/22  1048     History  Chief Complaint  Patient presents with   Palpitations    Lauren [name] is a 47 y.o. female with past medical history significant for previous ectopic pregnancy, kidney stone, cholecystectomy, appendectomy presents to the ED complaining of tingling throughout her body for the past few days.  She also feels like her heart is racing and has palpitations intermittently since the tingling began.  She has associated shortness of breath.  She also complains of lower abdominal pain for the past couple of days.  Denies nausea, vomiting, diarrhea, constipation, syncope, chest pain, chest tightness, fever, chills, melena, dysuria, flank pain, back pain, headache.    Spanish interpreter [phone], Regino Schultze utilized to obtain HPI.       Home Medications Prior to Admission medications   Medication Sig Start Date End Date Taking? Authorizing Provider  cephALEXin (KEFLEX) 500 MG capsule Take 1 capsule (500 mg total) by mouth 4 (four) times daily. 07/25/22  Yes Zykira Matlack R, PA-C  phenazopyridine (PYRIDIUM) 200 MG tablet Take 1 tablet (200 mg total) by mouth 3 (three) times daily. 07/25/22  Yes Lanora Reveron R, PA-C  potassium chloride SA (KLOR-CON M) 20 MEQ tablet Take 1 tablet (20 mEq total) by mouth 2 (two) times daily. 07/25/22  Yes Christia Domke R, PA-C  gabapentin (NEURONTIN) 100 MG capsule Take (1 tablet) 100 mg daily in morning and (2 tablets) 200 mg at bedtime. 07/16/19   Scot Jun, NP      Allergies    Patient has no known allergies.    Review of Systems   Review of Systems  Constitutional:  Negative for chills and fever.  Respiratory:  Positive for shortness of breath. Negative for chest tightness.   Cardiovascular:  Positive for palpitations. Negative for chest pain.  Gastrointestinal:  Positive for abdominal pain. Negative for blood  in stool, diarrhea, nausea and vomiting.  Genitourinary:  Negative for dysuria and flank pain.  Musculoskeletal:  Negative for back pain.  Neurological:  Negative for syncope, weakness, numbness and headaches.       Tingling     Physical Exam Updated Vital Signs BP 123/67   Pulse 92   Temp 98.1 F (36.7 C) (Oral)   Resp (!) 21   Ht 5\' 3"  (1.6 m)   Wt 73.9 kg   SpO2 98%   BMI 28.86 kg/m  Physical Exam Vitals and nursing note reviewed.  Constitutional:      General: She is not in acute distress.    Appearance: She is not ill-appearing.  HENT:     Mouth/Throat:     Mouth: Mucous membranes are moist.     Pharynx: Oropharynx is clear.  Cardiovascular:     Rate and Rhythm: Normal rate and regular rhythm.     Pulses: Normal pulses.     Heart sounds: Normal heart sounds.  Pulmonary:     Effort: Pulmonary effort is normal. Tachypnea (mild) present. No respiratory distress.     Breath sounds: Normal breath sounds and air entry.     Comments: Patient is mildly tachypneic on exam but has clear lung sounds bilaterally.  She does not appear to be in respiratory distress. Abdominal:     General: Abdomen is flat. Bowel sounds are normal. There is no distension.     Palpations: Abdomen is soft.     Tenderness: There is abdominal tenderness  in the right lower quadrant, suprapubic area and left lower quadrant. There is no right CVA tenderness or left CVA tenderness.  Skin:    General: Skin is warm and dry.     Capillary Refill: Capillary refill takes less than 2 seconds.     Coloration: Skin is not cyanotic or pale.  Neurological:     Mental Status: She is alert. Mental status is at baseline.     Sensory: Sensation is intact.     Motor: Motor function is intact.     Coordination: Coordination is intact.     Gait: Gait is intact.     Comments: Patient has normal sensation in bilateral upper and lower extremities with 5/5 strength.    Psychiatric:        Mood and Affect: Mood normal.         Behavior: Behavior normal.     ED Results / Procedures / Treatments   Labs (all labs ordered are listed, but only abnormal results are displayed) Labs Reviewed  BASIC METABOLIC PANEL - Abnormal; Notable for the following components:      Result Value   Potassium 2.9 (*)    CO2 21 (*)    Glucose, Bld 112 (*)    All other components within normal limits  CBC - Abnormal; Notable for the following components:   Platelets 491 (*)    All other components within normal limits  URINALYSIS, ROUTINE W REFLEX MICROSCOPIC - Abnormal; Notable for the following components:   Ketones, ur TRACE (*)    Leukocytes,Ua MODERATE (*)    Bacteria, UA RARE (*)    All other components within normal limits  HCG, SERUM, QUALITATIVE  D-DIMER, QUANTITATIVE  TROPONIN I (HIGH SENSITIVITY)  TROPONIN I (HIGH SENSITIVITY)    EKG EKG Interpretation  Date/Time:  Monday July 25 2022 11:02:20 EDT Ventricular Rate:  107 PR Interval:  136 QRS Duration: 72 QT Interval:  354 QTC Calculation: 472 R Axis:   82 Text Interpretation: Sinus tachycardia Nonspecific ST abnormality Abnormal ECG No previous ECGs available Confirmed by Fredia Sorrow [phone]) on 07/25/2022 11:09:36 AM  Radiology CT ABDOMEN PELVIS W CONTRAST  Result Date: 07/25/2022 CLINICAL DATA:  Abdominal pain, acute, nonlocalized. EXAM: CT ABDOMEN AND PELVIS WITH CONTRAST TECHNIQUE: Multidetector CT imaging of the abdomen and pelvis was performed using the standard protocol following bolus administration of intravenous contrast. RADIATION DOSE REDUCTION: This exam was performed according to the departmental dose-optimization program which includes automated exposure control, adjustment of the mA and/or kV according to patient size and/or use of iterative reconstruction technique. CONTRAST:  69mL OMNIPAQUE IOHEXOL 350 MG/ML SOLN COMPARISON:  06/24/2016. FINDINGS: Lower chest: No acute abnormality. Hepatobiliary: No focal liver abnormality is seen.  Status post cholecystectomy. No biliary dilatation. Pancreas: Unremarkable. No pancreatic ductal dilatation or surrounding inflammatory changes. Spleen: Normal. Adrenals/Urinary Tract: Adrenal glands are unremarkable. Kidneys are normal, without renal calculi, focal lesion, or hydronephrosis. Bladder is unremarkable. Stomach/Bowel: Normal stomach and duodenum. No dilated loops of small bowel. Appendix is not visualized. No bowel wall thickening or surrounding inflammation. Vascular/Lymphatic: No significant vascular findings are present. No enlarged abdominal or pelvic lymph nodes. Reproductive: Uterus and bilateral adnexa are unremarkable. Other: No abdominal wall hernia or abnormality. No abdominopelvic ascites. Musculoskeletal: Mild lower lumbar spondylosis. No acute or suspicious bone lesions. IMPRESSION: No acute abdominopelvic abnormality. Electronically Signed   By: Emmit Alexanders M.D.   On: 07/25/2022 14:03   DG Chest 2 View  Result Date: 07/25/2022 CLINICAL DATA:  Shortness of breath, palpitations EXAM: CHEST - 2 VIEW COMPARISON:  11/29/2016 FINDINGS: Normal heart size, mediastinal contours, and pulmonary vascularity. Lungs clear. No pulmonary infiltrate, pleural effusion, or pneumothorax. Osseous structures unremarkable. IMPRESSION: No acute abnormalities. Electronically Signed   By: Lavonia Dana M.D.   On: 07/25/2022 11:38    Procedures Procedures    Medications Ordered in ED Medications  potassium chloride SA (KLOR-CON M) CR tablet 40 mEq (40 mEq Oral Given 07/25/22 1247)  sodium chloride 0.9 % bolus 1,000 mL (0 mLs Intravenous Stopped 07/25/22 1606)  iohexol (OMNIPAQUE) 350 MG/ML injection 100 mL (70 mLs Intravenous Contrast Given 07/25/22 1336)  HYDROcodone-acetaminophen (NORCO/VICODIN) 5-325 MG per tablet 1 tablet (1 tablet Oral Given 07/25/22 1605)  cephALEXin (KEFLEX) capsule 500 mg (500 mg Oral Given 07/25/22 1605)    ED Course/ Medical Decision Making/ A&P                              Medical Decision Making Amount and/or Complexity of Data Reviewed Labs: ordered. Radiology: ordered.  Risk Prescription drug management.   This patient presents to the ED with chief complaint(s) of palpitations, tingling, shortness of breath with pertinent past medical history of history of ectopic pregnancy, thrombocytosis, headache, kidney stones.  The complaint involves an extensive differential diagnosis and also carries with it a high risk of complications and morbidity.    The differential diagnosis includes electrolyte derangement, infectious process, arrhythmia, anxiety, panic attack, anemia, hyperthyroidism, PE, dehydration  The initial plan is to obtain baseline labs, UA, D-dimer to rule out PE given patient's tachypnea and feeling of palpitations  Initial Assessment:   On exam, patient is mildly tachypneic but is not in acute respiratory distress.  Lungs are clear to auscultation bilaterally with adequate tidal volume.  Heart rate is normal in the upper 90s with regular rhythm.  Heart sounds are normal.  Abdomen is soft with tenderness to the lower quadrants.  Skin is warm and dry.  She has normal sensation in the face and bilateral upper and lower extremities.  5/5 strength in bilateral upper and lower extremities.  Patient is able to ambulate without assistance.  Independent ECG/labs interpretation:  The following labs were independently interpreted:  CBC without leukocytosis or anemia.  There is thrombocytosis, patient has known history of.  Metabolic panel with hypokalemia.  Renal function is preserved.  UA with evidence of UTI.  D-dimer, troponin, pregnancy all negative.  Independent visualization and interpretation of imaging: I independently visualized the following imaging with scope of interpretation limited to determining acute life threatening conditions related to emergency care: Chest x-ray, which revealed no evidence of acute cardiopulmonary process I agree with  radiologist interpretation.  CT abdomen pelvis was ordered and independently interpreted.  No evidence of acute intra-abdominal abnormality to explain patient's symptoms.  Treatment and Reassessment: Patient was given IVF and oral potassium.  Patient continued to have some discomfort, was given 1 dose of oral pain medicine.  Upon reassessment, patient reports feeling significantly better.  Will give patient her first dose of antibiotic for UTI here in the ED.  I believe that patient is appropriate for discharge home with primary care follow-up.  Disposition:   Patient will be discharged with primary care follow-up.  Patient reports that she has not seen a doctor in "quite some time".  Will provide her the health connect phone number to get established with primary care.  Highly recommended that she schedule an  appointment as she will need to have her lab work repeated.  Patient verbalized her understanding.  Spanish interpreter utilized for discharge instructions.  The patient has been appropriately medically screened and/or stabilized in the ED. I have low suspicion for any other emergent medical condition which would require further screening, evaluation or treatment in the ED or require inpatient management. At time of discharge the patient is hemodynamically stable and in no acute distress. I have discussed work-up results and diagnosis with patient and answered all questions. Patient is agreeable with discharge plan. We discussed strict return precautions for returning to the emergency department and they verbalized understanding.            Final Clinical Impression(s) / ED Diagnoses Final diagnoses:  Acute cystitis without hematuria  Hypokalemia    Rx / DC Orders ED Discharge Orders          Ordered    potassium chloride SA (KLOR-CON M) 20 MEQ tablet  2 times daily        07/25/22 1624    phenazopyridine (PYRIDIUM) 200 MG tablet  3 times daily        07/25/22 1624     cephALEXin (KEFLEX) 500 MG capsule  4 times daily        07/25/22 [address], PA-C 07/25/22 1908    Vanetta Mulders, MD 07/29/22 1623

## 2022-07-25 NOTE — ED Triage Notes (Signed)
Patient here POV from Home.  Endorses Tingling throughout her Body for a few days now. That has remained constant mostly since it began and also notes forms of tachycardia and palpitations intermittently since then as well. No Discernable CP. Some SOB noted as well.   No N/V/D.   Tearful during Triage. A&Ox4. GCS 15. BIB Wheelchair.

## 2022-07-25 NOTE — Discharge Instructions (Addendum)
Gracias por permitirme ser parte de su cuidado hoy. Su anlisis mostr que tiene niveles bajos de Field seismologist. Esto puede provocar algunos de los sntomas que tiene. He enviado una receta de potasio a su farmacia.  Su orina muestra que tiene una infeccin. Le he enviado un antibitico a su farmacia para tratar esto. Recibi su primera dosis mientras estaba en la sala de emergencias. Tmelo segn lo prescrito y durante todo el tratamiento, incluso si comienza a Sports administrator.  Recomiendo seguimiento con un mdico de atencin primaria. Le proporcion el nmero de telfono de [employer], ellos lo ayudarn a Psychiatrist cita.  Regrese a la sala de emergencias si sus sntomas empeoran o si tiene alguna inquietud nueva.

## 2022-09-22 ENCOUNTER — Encounter (HOSPITAL_COMMUNITY): Payer: Self-pay

## 2022-09-22 ENCOUNTER — Emergency Department (HOSPITAL_COMMUNITY)
Admission: EM | Admit: 2022-09-22 | Discharge: 2022-09-22 | Disposition: A | Discharge status: Home / Self Care | Payer: Self-pay | Attending: Emergency Medicine | Admitting: Emergency Medicine

## 2022-09-22 ENCOUNTER — Other Ambulatory Visit: Payer: Self-pay

## 2022-09-22 DIAGNOSIS — E876 Hypokalemia: Secondary | ICD-10-CM | POA: Insufficient documentation

## 2022-09-22 DIAGNOSIS — Z1152 Encounter for screening for COVID-19: Secondary | ICD-10-CM | POA: Insufficient documentation

## 2022-09-22 DIAGNOSIS — D75839 Thrombocytosis, unspecified: Secondary | ICD-10-CM | POA: Insufficient documentation

## 2022-09-22 DIAGNOSIS — D72829 Elevated white blood cell count, unspecified: Secondary | ICD-10-CM | POA: Insufficient documentation

## 2022-09-22 DIAGNOSIS — J020 Streptococcal pharyngitis: Secondary | ICD-10-CM | POA: Insufficient documentation

## 2022-09-22 LAB — COMPREHENSIVE METABOLIC PANEL
ALT: 36 U/L (ref 0–44)
AST: 33 U/L (ref 15–41)
Albumin: 3.7 g/dL (ref 3.5–5.0)
Alkaline Phosphatase: 187 U/L — ABNORMAL HIGH (ref 38–126)
Anion gap: 12 (ref 5–15)
BUN: 13 mg/dL (ref 6–20)
CO2: 24 mmol/L (ref 22–32)
Calcium: 9.8 mg/dL (ref 8.9–10.3)
Chloride: 102 mmol/L (ref 98–111)
Creatinine, Ser: 0.41 mg/dL — ABNORMAL LOW (ref 0.44–1.00)
GFR, Estimated: >60 mL/min (ref >60)
Glucose, Bld: 90 mg/dL (ref 70–99)
Potassium: 2.9 mmol/L — ABNORMAL LOW (ref 3.5–5.1)
Sodium: 138 mmol/L (ref 135–145)
Total Bilirubin: 0.6 mg/dL (ref 0.3–1.2)
Total Protein: 8 g/dL (ref 6.5–8.1)

## 2022-09-22 LAB — RESP PANEL BY RT-PCR (RSV, FLU A&B, COVID)  RVPGX2
Influenza A by PCR: NEGATIVE
Influenza B by PCR: NEGATIVE
Resp Syncytial Virus by PCR: NEGATIVE
SARS Coronavirus 2 by RT PCR: NEGATIVE

## 2022-09-22 LAB — CBC WITH DIFFERENTIAL/PLATELET
Abs Immature Granulocytes: 0.03 10*3/uL (ref 0.00–0.07)
Basophils Absolute: 0 10*3/uL (ref 0.0–0.1)
Basophils Relative: 0 %
Eosinophils Absolute: 0.3 10*3/uL (ref 0.0–0.5)
Eosinophils Relative: 3 %
HCT: 38 % (ref 36.0–46.0)
Hemoglobin: 12.4 g/dL (ref 12.0–15.0)
Immature Granulocytes: 0 %
Lymphocytes Relative: 23 %
Lymphs Abs: 2.8 10*3/uL (ref 0.7–4.0)
MCH: 26.7 pg (ref 26.0–34.0)
MCHC: 32.6 g/dL (ref 30.0–36.0)
MCV: 81.7 fL (ref 80.0–100.0)
Monocytes Absolute: 0.7 10*3/uL (ref 0.1–1.0)
Monocytes Relative: 6 %
Neutro Abs: 8.4 10*3/uL — ABNORMAL HIGH (ref 1.7–7.7)
Neutrophils Relative %: 68 %
Platelets: 594 10*3/uL — ABNORMAL HIGH (ref 150–400)
RBC: 4.65 MIL/uL (ref 3.87–5.11)
RDW: 14.4 % (ref 11.5–15.5)
WBC: 12.2 10*3/uL — ABNORMAL HIGH (ref 4.0–10.5)
nRBC: 0 % (ref 0.0–0.2)

## 2022-09-22 LAB — POTASSIUM: Potassium: 3.8 mmol/L (ref 3.5–5.1)

## 2022-09-22 LAB — GROUP A STREP BY PCR: Group A Strep by PCR: DETECTED — AB

## 2022-09-22 MED ORDER — POTASSIUM CHLORIDE 10 MEQ/100ML IV SOLN
10.0000 meq | INTRAVENOUS | Status: AC
  Administered 2022-09-22 (×2): 10 meq via INTRAVENOUS
  Filled 2022-09-22 (×2): qty 100

## 2022-09-22 MED ORDER — PENICILLIN G BENZATHINE 1200000 UNIT/2ML IM SUSY
1.2000 10*6.[IU] | PREFILLED_SYRINGE | Freq: Once | INTRAMUSCULAR | Status: AC
  Administered 2022-09-22: 1.2 10*6.[IU] via INTRAMUSCULAR
  Filled 2022-09-22: qty 2

## 2022-09-22 MED ORDER — ACETAMINOPHEN 325 MG PO TABS
650.0000 mg | ORAL_TABLET | Freq: Once | ORAL | Status: AC
  Administered 2022-09-22: 650 mg via ORAL
  Filled 2022-09-22: qty 2

## 2022-09-22 MED ORDER — POTASSIUM CHLORIDE CRYS ER 20 MEQ PO TBCR
40.0000 meq | EXTENDED_RELEASE_TABLET | Freq: Once | ORAL | Status: AC
  Administered 2022-09-22: 40 meq via ORAL
  Filled 2022-09-22: qty 2

## 2022-09-22 MED ORDER — SODIUM CHLORIDE 0.9 % IV BOLUS
1000.0000 mL | Freq: Once | INTRAVENOUS | Status: AC
  Administered 2022-09-22: 1000 mL via INTRAVENOUS

## 2022-09-22 NOTE — ED Provider Notes (Signed)
[place] EMERGENCY DEPARTMENT AT [facility] Provider Note   CSN: [id number] Arrival date & time: 09/22/22  1205     History Chief Complaint  Patient presents with   Fatigue   Generalized Body Aches   Hand Cramps   Sore Throat    Lauren [name] is a 47 y.o. female patient who presents to the emergency department with general malaise, general body aches, sore throat, and bilateral hand numbness.  She states that the symptoms started 1 week ago she initially was sick and got better and now feels ill again.  He states she had this numbness in the past when she had hypokalemia.  This went away when it was repleted.  She denies any shortness of breath, chest pain currently, hematuria, abdominal pain, nausea, vomiting, diarrhea, dysuria.  She denies any syncope.    Sore Throat       Home Medications Prior to Admission medications   Medication Sig Start Date End Date Taking? Authorizing Provider  cephALEXin (KEFLEX) 500 MG capsule Take 1 capsule (500 mg total) by mouth 4 (four) times daily. 07/25/22   Clark, Meghan R, PA-C  gabapentin (NEURONTIN) 100 MG capsule Take (1 tablet) 100 mg daily in morning and (2 tablets) 200 mg at bedtime. 07/16/19   Bing Neighbors, NP  phenazopyridine (PYRIDIUM) 200 MG tablet Take 1 tablet (200 mg total) by mouth 3 (three) times daily. 07/25/22   Clark, Meghan R, PA-C  potassium chloride SA (KLOR-CON M) 20 MEQ tablet Take 1 tablet (20 mEq total) by mouth 2 (two) times daily. 07/25/22   Melton Alar R, PA-C      Allergies    Patient has no known allergies.    Review of Systems   Review of Systems  All other systems reviewed and are negative.   Physical Exam Updated Vital Signs BP 120/60   Pulse 94   Temp 98.2 F (36.8 C) (Oral)   Resp (!) 21   SpO2 95%  Physical Exam Vitals and nursing note reviewed.  Constitutional:      General: She is not in acute distress.    Appearance: Normal appearance.  HENT:     Head:  Normocephalic and atraumatic.  Eyes:     General:        Right eye: No discharge.        Left eye: No discharge.  Cardiovascular:     Comments: Regular rate and rhythm.  S1/S2 are distinct without any evidence of murmur, rubs, or gallops.  Radial pulses are 2+ bilaterally.  Dorsalis pedis pulses are 2+ bilaterally.  No evidence of pedal edema. Pulmonary:     Comments: Clear to auscultation bilaterally.  Normal effort.  No respiratory distress.  No evidence of wheezes, rales, or rhonchi heard throughout. Abdominal:     General: Abdomen is flat. Bowel sounds are normal. There is no distension.     Tenderness: There is no abdominal tenderness. There is no guarding or rebound.  Musculoskeletal:        General: Normal range of motion.     Cervical back: Neck supple.  Skin:    General: Skin is warm and dry.     Findings: No rash.  Neurological:     General: No focal deficit present.     Mental Status: She is alert.  Psychiatric:        Mood and Affect: Mood normal.        Behavior: Behavior normal.     ED Results /  Procedures / Treatments   Labs (all labs ordered are listed, but only abnormal results are displayed) Labs Reviewed  GROUP A STREP BY PCR - Abnormal; Notable for the following components:      Result Value   Group A Strep by PCR DETECTED (*)    All other components within normal limits  CBC WITH DIFFERENTIAL/PLATELET - Abnormal; Notable for the following components:   WBC 12.2 (*)    Platelets 594 (*)    Neutro Abs 8.4 (*)    All other components within normal limits  COMPREHENSIVE METABOLIC PANEL - Abnormal; Notable for the following components:   Potassium 2.9 (*)    Creatinine, Ser 0.41 (*)    Alkaline Phosphatase 187 (*)    All other components within normal limits  RESP PANEL BY RT-PCR (RSV, FLU A&B, COVID)  RVPGX2  POTASSIUM    EKG None  Radiology No results found.  Procedures Procedures    Medications Ordered in ED Medications  sodium chloride  0.9 % bolus 1,000 mL (0 mLs Intravenous Stopped 09/22/22 1450)  acetaminophen (TYLENOL) tablet 650 mg (650 mg Oral Given 09/22/22 1322)  potassium chloride SA (KLOR-CON M) CR tablet 40 mEq (40 mEq Oral Given 09/22/22 1446)  potassium chloride 10 mEq in 100 mL IVPB (10 mEq Intravenous New Bag/Given 09/22/22 1555)  penicillin g benzathine (BICILLIN LA) 1200000 UNIT/2ML injection 1.2 Million Units (1.2 Million Units Intramuscular Given 09/22/22 1447)    ED Course/ Medical Decision Making/ A&P Clinical Course as of 09/22/22 1800  Thu Sep 22, 2022  1357 CBC with Differential(!) There is evidence of leukocytosis and thrombocytosis. [CF]  1421 Group A Strep by PCR(!) Patient tested positive for strep throat which is likely the cause of her symptoms. [CF]  1513 Comprehensive metabolic panel(!) There is evidence of significant hypokalemia which is likely the cause of her bilateral hand numbness. [CF]  1513 Resp panel by RT-PCR (RSV, Flu A&B, Covid) Anterior Nasal Swab Negative. [CF]  1759 Potassium Repeat potassium is now within normal limits. [CF]    Clinical Course User Index [CF] Teressa Lower, PA-C   {   Click here for ABCD2, HEART and other calculators  Medical Decision Making Lauren [name] is a 47 y.o. female patient who presents to the emergency department today for further evaluation of general malaise, general body aches, and sore throat.  Patient does report subjective fever.  Vital signs are normal here today apart from some mild tachypnea.  Will plan to get viral panel in addition to strep.  Will also look for electrolyte abnormalities particularly hypokalemia in addition to an EKG to evaluate for any T wave abnormalities.  I will also plan to give her some fluids and Tylenol.  Patient's potassium was repleted here in the emergency room.  Patient given Bicillin for strep pharyngitis.  She will follow-up with her primary care doctor.  Strict return precautions were  discussed.  She is safe for discharge at this time.  Amount and/or Complexity of Data Reviewed Labs: ordered. Decision-making details documented in ED Course.  Risk OTC drugs. Prescription drug management.    Final Clinical Impression(s) / ED Diagnoses Final diagnoses:  Strep pharyngitis  Hypokalemia    Rx / DC Orders ED Discharge Orders     None         Teressa Lower, [place] 09/22/22 1800    Loetta Rough, MD 09/23/22 1045

## 2022-09-22 NOTE — Discharge Instructions (Signed)
I would like you to follow-up with your primary care doctor for further evaluation.  You may return to the emergency department for any worsening symptoms.

## 2022-09-22 NOTE — ED Triage Notes (Addendum)
Pt sent here from walk-in clinic via EMS. Pt c/o fatigue, hand cramps, eye twitching, and sore throat x1 week. Patient states she has been taking tylenol and ibuprofen at home with some relief. Patient states fevers at home.
